# Patient Record
Sex: Male | Born: 1965
Health system: Southern US, Community
[De-identification: ages and names within clinical notes are randomized; demographics above are authoritative.]

## PROBLEM LIST (undated history)

## (undated) DIAGNOSIS — G4730 Sleep apnea, unspecified: Secondary | ICD-10-CM

## (undated) DIAGNOSIS — G588 Other specified mononeuropathies: Secondary | ICD-10-CM

## (undated) DIAGNOSIS — J439 Emphysema, unspecified: Secondary | ICD-10-CM

## (undated) HISTORY — DX: Emphysema, unspecified: J43.9

## (undated) HISTORY — PX: WISDOM TOOTH EXTRACTION: SHX21

## (undated) HISTORY — DX: Sleep apnea, unspecified: G47.30

## (undated) HISTORY — PX: COLONOSCOPY: SHX174

## (undated) HISTORY — DX: Other specified mononeuropathies: G58.8

## (undated) SURGICAL SUPPLY — 3 items
NDL HYPO 21X1.5 SAFETY (NEEDLE) IMPLANT
SOLN 0.9% NACL 1000 ML (IV SOLUTION) ×1 IMPLANT
SOLN STERILE WATER 1000 ML (IV SOLUTION) ×1 IMPLANT

---

## 2009-03-22 ENCOUNTER — Emergency Department (HOSPITAL_COMMUNITY): Admission: EM | Admit: 2009-03-22 | Discharge: 2009-03-22 | Payer: Self-pay | Admitting: Emergency Medicine

## 2012-05-19 ENCOUNTER — Emergency Department (HOSPITAL_COMMUNITY): Payer: 59

## 2012-05-19 ENCOUNTER — Encounter (HOSPITAL_COMMUNITY): Payer: Self-pay | Admitting: Neurology

## 2012-05-19 ENCOUNTER — Emergency Department (HOSPITAL_COMMUNITY)
Admission: EM | Admit: 2012-05-19 | Discharge: 2012-05-19 | Disposition: A | Discharge status: Home / Self Care | Payer: 59 | Attending: Emergency Medicine | Admitting: Emergency Medicine

## 2012-05-19 DIAGNOSIS — Z79899 Other long term (current) drug therapy: Secondary | ICD-10-CM | POA: Insufficient documentation

## 2012-05-19 DIAGNOSIS — R55 Syncope and collapse: Secondary | ICD-10-CM | POA: Insufficient documentation

## 2012-05-19 DIAGNOSIS — F17200 Nicotine dependence, unspecified, uncomplicated: Secondary | ICD-10-CM | POA: Insufficient documentation

## 2012-05-19 DIAGNOSIS — R110 Nausea: Secondary | ICD-10-CM | POA: Insufficient documentation

## 2012-05-19 LAB — BASIC METABOLIC PANEL
CO2: 25 mEq/L (ref 19–32)
Chloride: 104 mEq/L (ref 96–112)
GFR calc Af Amer: >90 mL/min (ref >90)
Potassium: 4.5 mEq/L (ref 3.5–5.1)
Sodium: 137 mEq/L (ref 135–145)

## 2012-05-19 LAB — CBC WITH DIFFERENTIAL/PLATELET
Basophils Absolute: 0.1 10*3/uL (ref 0.0–0.1)
Basophils Relative: 1 % (ref 0–1)
HCT: 43.1 % (ref 39.0–52.0)
Lymphocytes Relative: 11 % — ABNORMAL LOW (ref 12–46)
MCHC: 34.6 g/dL (ref 30.0–36.0)
Monocytes Absolute: 1.2 10*3/uL — ABNORMAL HIGH (ref 0.1–1.0)
Neutro Abs: 14 10*3/uL — ABNORMAL HIGH (ref 1.7–7.7)
Neutrophils Relative %: 81 % — ABNORMAL HIGH (ref 43–77)
Platelets: 249 10*3/uL (ref 150–400)
RDW: 13.1 % (ref 11.5–15.5)
WBC: 17.3 10*3/uL — ABNORMAL HIGH (ref 4.0–10.5)

## 2012-05-19 LAB — TROPONIN I: Troponin I: <0.3 ng/mL (ref <0.30)

## 2012-05-19 MED ORDER — SODIUM CHLORIDE 0.9 % IV SOLN: 1000.0000 mL | INTRAVENOUS | Status: DC

## 2012-05-19 MED ORDER — ONDANSETRON HCL 4 MG/2ML IJ SOLN
INTRAMUSCULAR | Status: AC
  Filled 2012-05-19: qty 2

## 2012-05-19 MED ORDER — SODIUM CHLORIDE 0.9 % IV SOLN
1000.0000 mL | Freq: Once | INTRAVENOUS | Status: AC
  Administered 2012-05-19: 1000 mL via INTRAVENOUS

## 2012-05-19 NOTE — ED Notes (Signed)
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 Walked out of room and laid down, felt weak and was nausea. There was no LOC. Denying any cp or sob. EKG showing inverted t-waves. CBG 131. BP sitting 120/62, standing 170/94. 4 zofran given. 18 gauge L AC. Still feeling dizzy at current. Skin warm and dry. A x 4.

## 2012-05-19 NOTE — ED Notes (Signed)
Pt sitting in chair, reporting dizziness has improved but "Not feeling 100%".

## 2012-05-19 NOTE — ED Notes (Signed)
Patient transported to X-ray 

## 2012-05-19 NOTE — ED Provider Notes (Signed)
History     CSN: [id number]  Arrival date & time 05/19/12  1038   First MD Initiated Contact with Patient 05/19/12 1040      Chief Complaint  Patient presents with  . Near Syncope     The history is provided by the patient.   the patient reports he has been without any symptoms for the past several days and woke up this morning and felt normal.  At work he was walking into a room and became "dizzy" which she describes as a feeling of almost passing out.  He began to feel weak and nauseated and laid himself down on the floor so they didn't pass out.  He had no preceding chest pain or chest tightness.  No palpitations.  He reports at this time he still feels slightly weak but has no other symptoms.  He denies melena and hematochezia.  He denies recent nausea vomiting diarrhea.  He reports his appetite has been normal.  He smokes cigarettes but uses no drugs.  Daily medication he takes is occasional Advil PM at night to help him with sleep.  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  He never usually has breakfast only coffee which he continued to do today.  He reports no significant past medical history but he also reports that he does not have a primary care physician.  He denies all neck pain jaw pain shoulder pain or any other anginal symptoms.  He's had no recent illness  History reviewed. No pertinent past medical history.  History reviewed. No pertinent past surgical history.  No family history on file.  History  Substance Use Topics  . Smoking status: Current Every Day Smoker  . Smokeless tobacco: Not on file  . Alcohol Use: No      Review of Systems  All other systems reviewed and are negative.    Allergies  Review of patient's allergies indicates no known allergies.  Home Medications   Current Outpatient Rx  Name Route Sig Dispense Refill  . ADVIL PM PO Oral Take 1 tablet by  mouth at bedtime as needed. For sleep      BP 109/64  Pulse 64  Temp 98.4 F (36.9 C) (Oral)  Resp 17  SpO2 99%  Physical Exam  Nursing note and vitals reviewed. Constitutional: He is oriented to person, place, and time. He appears well-developed and well-nourished.  HENT:  Head: Normocephalic and atraumatic.  Eyes: EOM are normal. Pupils are equal, round, and reactive to light.  Neck: Normal range of motion.  Cardiovascular: Normal rate, regular rhythm, normal heart sounds and intact distal pulses.   Pulmonary/Chest: Effort normal and breath sounds normal. No respiratory distress.  Abdominal: Soft. He exhibits no distension. There is no tenderness.  Musculoskeletal: Normal range of motion.  Neurological: He is alert and oriented to person, place, and time.       5/5 strength in major muscle groups of  bilateral upper and lower extremities. Speech normal. No facial asymetry.   Skin: Skin is warm and dry.  Psychiatric: He has a normal mood and affect. Judgment normal.    ED Course  Procedures (including critical care time)   Date: 05/19/2012  Rate: 67  Rhythm: normal sinus rhythm  QRS Axis: normal  Intervals: normal  ST/T Wave abnormalities: none specific ST changes, likely early repol  Conduction Disutrbances: none  Narrative Interpretation:  Old EKG Reviewed: No prior ecg     Labs Reviewed  CBC WITH DIFFERENTIAL - Abnormal; Notable for the following:    WBC 17.3 (*)     Neutrophils Relative 81 (*)     Neutro Abs 14.0 (*)     Lymphocytes Relative 11 (*)     Monocytes Absolute 1.2 (*)     All other components within normal limits  BASIC METABOLIC PANEL - Abnormal; Notable for the following:    Glucose, Bld 127 (*)     All other components within normal limits  TROPONIN I   Dg Chest 2 View  05/19/2012  *RADIOLOGY REPORT*  Clinical Data: Syncope at work, nausea  CHEST - 2 VIEW  Comparison: None  Findings: Normal heart size, mediastinal contours, and pulmonary  vascularity. Peribronchial thickening. No definite infiltrate, pleural effusion or pneumothorax. Bones unremarkable.  IMPRESSION: No acute abnormalities.   Original Report Authenticated By: Lollie Marrow, M.D.     I personally reviewed the imaging tests through PACS system    1. Near syncope       MDM  The patient feels much better after IV fluids.  We walked around the emergency department and had no difficulty ambulating.  He has no complaints at this time and states he feels much better.  He's never had any chest pain through any of his events.  I doubt this is a cardiac etiology.  Doubt arrhythmia.  He may have been slightly dehydrated as he feels better after IV fluids.  Normal neurologic exam.  At time of discharge the patient is completely asymptomatic.  I recommended developing a relationship with a primary care physician        Lyanne Co, MD 05/19/12 1414

## 2012-05-19 NOTE — ED Notes (Signed)
Pt ambulated well in hallway. Tolerated well.

## 2015-02-17 ENCOUNTER — Other Ambulatory Visit (HOSPITAL_COMMUNITY): Payer: Self-pay | Admitting: Preventative Medicine

## 2015-02-17 DIAGNOSIS — IMO0002 Reserved for concepts with insufficient information to code with codable children: Secondary | ICD-10-CM

## 2015-02-17 DIAGNOSIS — R9389 Abnormal findings on diagnostic imaging of other specified body structures: Secondary | ICD-10-CM

## 2015-02-17 DIAGNOSIS — R229 Localized swelling, mass and lump, unspecified: Secondary | ICD-10-CM

## 2015-02-21 ENCOUNTER — Ambulatory Visit (HOSPITAL_COMMUNITY)
Admission: RE | Admit: 2015-02-21 | Discharge: 2015-02-21 | Disposition: A | Discharge status: Home / Self Care | Payer: 59 | Source: Ambulatory Visit | Attending: Preventative Medicine | Admitting: Preventative Medicine

## 2015-02-21 DIAGNOSIS — F17200 Nicotine dependence, unspecified, uncomplicated: Secondary | ICD-10-CM | POA: Diagnosis not present

## 2015-02-21 DIAGNOSIS — IMO0002 Reserved for concepts with insufficient information to code with codable children: Secondary | ICD-10-CM

## 2015-02-21 DIAGNOSIS — R229 Localized swelling, mass and lump, unspecified: Secondary | ICD-10-CM

## 2015-02-21 DIAGNOSIS — J439 Emphysema, unspecified: Secondary | ICD-10-CM | POA: Insufficient documentation

## 2015-02-21 DIAGNOSIS — R918 Other nonspecific abnormal finding of lung field: Secondary | ICD-10-CM | POA: Diagnosis present

## 2015-02-21 DIAGNOSIS — R9389 Abnormal findings on diagnostic imaging of other specified body structures: Secondary | ICD-10-CM

## 2015-02-21 MED ORDER — IOHEXOL 300 MG/ML  SOLN
75.0000 mL | Freq: Once | INTRAMUSCULAR | Status: AC | PRN
  Administered 2015-02-21: 75 mL via INTRAVENOUS

## 2015-03-10 ENCOUNTER — Other Ambulatory Visit (HOSPITAL_COMMUNITY): Payer: Self-pay | Admitting: Respiratory Therapy

## 2015-03-10 DIAGNOSIS — J439 Emphysema, unspecified: Secondary | ICD-10-CM

## 2015-03-22 ENCOUNTER — Ambulatory Visit (HOSPITAL_COMMUNITY)
Admission: RE | Admit: 2015-03-22 | Discharge: 2015-03-22 | Disposition: A | Discharge status: Home / Self Care | Payer: 59 | Source: Ambulatory Visit | Attending: Pulmonary Disease | Admitting: Pulmonary Disease

## 2015-03-22 DIAGNOSIS — J439 Emphysema, unspecified: Secondary | ICD-10-CM | POA: Diagnosis present

## 2015-03-22 MED ORDER — ALBUTEROL SULFATE (2.5 MG/3ML) 0.083% IN NEBU
2.5000 mg | INHALATION_SOLUTION | Freq: Once | RESPIRATORY_TRACT | Status: AC
  Administered 2015-03-22: 2.5 mg via RESPIRATORY_TRACT

## 2015-03-23 LAB — PULMONARY FUNCTION TEST
DL/VA % pred: 70 %
DL/VA: 3.24 ml/min/mmHg/L
DLCO UNC: 24.69 ml/min/mmHg
DLCO unc % pred: 79 %
FEF 25-75 POST: 3.06 L/s
FEF 25-75 Pre: 3.14 L/sec
FEF2575-%CHANGE-POST: -2 %
FEF2575-%PRED-POST: 86 %
FEF2575-%PRED-PRE: 88 %
FEV1-%Change-Post: 0 %
FEV1-%PRED-POST: 90 %
FEV1-%Pred-Pre: 90 %
FEV1-POST: 3.51 L
FEV1-PRE: 3.52 L
FEV1FVC-%Change-Post: 2 %
FEV1FVC-%PRED-PRE: 99 %
FEV6-%Change-Post: -1 %
FEV6-%Pred-Post: 92 %
FEV6-%Pred-Pre: 94 %
FEV6-POST: 4.45 L
FEV6-Pre: 4.54 L
FEV6FVC-%CHANGE-POST: 0 %
FEV6FVC-%Pred-Post: 103 %
FEV6FVC-%Pred-Pre: 102 %
FVC-%CHANGE-POST: -2 %
FVC-%Pred-Post: 89 %
FVC-%Pred-Pre: 91 %
FVC-POST: 4.45 L
FVC-Pre: 4.56 L
PRE FEV1/FVC RATIO: 77 %
PRE FEV6/FVC RATIO: 100 %
Post FEV1/FVC ratio: 79 %
Post FEV6/FVC ratio: 100 %
RV % PRED: 89 %
RV: 1.72 L
TLC % pred: 93 %
TLC: 6.3 L

## 2015-08-11 ENCOUNTER — Encounter: Payer: Self-pay | Admitting: Family Medicine

## 2015-08-11 ENCOUNTER — Ambulatory Visit (INDEPENDENT_AMBULATORY_CARE_PROVIDER_SITE_OTHER): Payer: 59 | Admitting: Family Medicine

## 2015-08-11 VITALS — BP 128/82 | Ht 69.0 in | Wt 218.8 lb

## 2015-08-11 DIAGNOSIS — R5383 Other fatigue: Secondary | ICD-10-CM

## 2015-08-11 DIAGNOSIS — R0683 Snoring: Secondary | ICD-10-CM

## 2015-08-11 DIAGNOSIS — R0600 Dyspnea, unspecified: Secondary | ICD-10-CM

## 2015-08-11 DIAGNOSIS — R0609 Other forms of dyspnea: Secondary | ICD-10-CM

## 2015-08-11 DIAGNOSIS — L709 Acne, unspecified: Secondary | ICD-10-CM

## 2015-08-11 NOTE — Progress Notes (Signed)
   Subjective:    Patient ID: Aaron Griffin, male    DOB: November 13, 1965, 49 y.o.   MRN: [id number]  HPI  The patient comes in today for a wellness visit.    A review of their health history was completed.  A review of medications was also completed.  Any needed refills; none  Eating habits: no but sometimes tries  Falls/  MVA accidents in past few months: no  Regular exercise: physical job, walk and golf  Specialist pt sees on regular basis: Dr Luan Pulling for pulmonary nodule  Preventative health issues were discussed.   Additional concerns: fatigue  no family history of colon or prostate cancer  So even though patient came in for a wellness exam his main focus was his fatigue and tiredness he actually wanted are full focus to be on this and do a wellness at another time he no longer smokes he does not drink he relates that he denies any chest tightness but does get short of breath whenever he pushes himself in addition to this patient relates a time tiredness and fatigue that does not get better with rest he finds himself feeling fatigued quite easily he denies rectal bleeding denies hematuria he does state he snores at night Review of Systems  Constitutional: Negative for activity change, appetite change and fatigue.  HENT: Negative for congestion.   Respiratory: Negative for cough.   Cardiovascular: Negative for chest pain.  Gastrointestinal: Negative for abdominal pain.  Endocrine: Negative for polydipsia and polyphagia.  Musculoskeletal: Positive for myalgias and arthralgias.  Neurological: Negative for weakness.  Psychiatric/Behavioral: Negative for confusion.       Objective:   Physical Exam  Constitutional: He appears well-nourished. No distress.  Cardiovascular: Normal rate, regular rhythm and normal heart sounds.   No murmur heard. Pulmonary/Chest: Effort normal and breath sounds normal. No respiratory distress.  Musculoskeletal: He exhibits no edema.    Lymphadenopathy:    He has no cervical adenopathy.  Neurological: He is alert.  Psychiatric: His behavior is normal.  Vitals reviewed.  In order to comprehensively evaluate his fatigue and tiredness multiple discussions and questions were had greater than half was spent in answering questions 30 minutes spent with patient       Assessment & Plan:  Patient came in today may focus was his fatigue. This is multi-factorial. Partly deconditioning, mild obesity, former smoker, possible sleep apnea  Will do lab work. Probably will also need sleep study Has elevated risk the [place] eating regular physical activity.

## 2015-08-16 LAB — HEPATIC FUNCTION PANEL
ALBUMIN: 4.3 g/dL (ref 3.5–5.5)
ALT: 39 IU/L (ref 0–44)
AST: 25 IU/L (ref 0–40)
Alkaline Phosphatase: 69 IU/L (ref 39–117)
BILIRUBIN TOTAL: 0.4 mg/dL (ref 0.0–1.2)
Bilirubin, Direct: 0.09 mg/dL (ref 0.00–0.40)
TOTAL PROTEIN: 7.5 g/dL (ref 6.0–8.5)

## 2015-08-16 LAB — BASIC METABOLIC PANEL
BUN / CREAT RATIO: 13 (ref 9–20)
BUN: 14 mg/dL (ref 6–24)
CHLORIDE: 101 mmol/L (ref 96–106)
CO2: 22 mmol/L (ref 18–29)
Calcium: 9.6 mg/dL (ref 8.7–10.2)
Creatinine, Ser: 1.12 mg/dL (ref 0.76–1.27)
GFR, EST AFRICAN AMERICAN: 90 mL/min/{1.73_m2} (ref >59)
GFR, EST NON AFRICAN AMERICAN: 78 mL/min/{1.73_m2} (ref >59)
Glucose: 107 mg/dL — ABNORMAL HIGH (ref 65–99)
POTASSIUM: 4.4 mmol/L (ref 3.5–5.2)
SODIUM: 140 mmol/L (ref 134–144)

## 2015-08-16 LAB — CBC WITH DIFFERENTIAL/PLATELET
BASOS: 1 %
Basophils Absolute: 0.1 10*3/uL (ref 0.0–0.2)
EOS (ABSOLUTE): 0.3 10*3/uL (ref 0.0–0.4)
EOS: 3 %
HEMATOCRIT: 42.2 % (ref 37.5–51.0)
Hemoglobin: 14.5 g/dL (ref 12.6–17.7)
Immature Grans (Abs): 0 10*3/uL (ref 0.0–0.1)
Immature Granulocytes: 0 %
LYMPHS ABS: 4 10*3/uL — AB (ref 0.7–3.1)
Lymphs: 40 %
MCH: 31 pg (ref 26.6–33.0)
MCHC: 34.4 g/dL (ref 31.5–35.7)
MCV: 90 fL (ref 79–97)
MONOS ABS: 1.1 10*3/uL — AB (ref 0.1–0.9)
Monocytes: 12 %
Neutrophils Absolute: 4.4 10*3/uL (ref 1.4–7.0)
Neutrophils: 44 %
PLATELETS: 284 10*3/uL (ref 150–379)
RBC: 4.67 x10E6/uL (ref 4.14–5.80)
RDW: 13.4 % (ref 12.3–15.4)
WBC: 10 10*3/uL (ref 3.4–10.8)

## 2015-08-16 LAB — C-REACTIVE PROTEIN: CRP: 2.1 mg/L (ref 0.0–4.9)

## 2015-08-16 LAB — TESTOSTERONE: Testosterone: 410 ng/dL (ref 348–1197)

## 2015-08-16 LAB — LIPID PANEL
CHOLESTEROL TOTAL: 219 mg/dL — AB (ref 100–199)
Chol/HDL Ratio: 6.6 ratio units — ABNORMAL HIGH (ref 0.0–5.0)
HDL: 33 mg/dL — ABNORMAL LOW (ref >39)
LDL CALC: 107 mg/dL — AB (ref 0–99)
Triglycerides: 393 mg/dL — ABNORMAL HIGH (ref 0–149)
VLDL Cholesterol Cal: 79 mg/dL — ABNORMAL HIGH (ref 5–40)

## 2015-08-16 LAB — TSH: TSH: 2.12 u[IU]/mL (ref 0.450–4.500)

## 2015-08-16 LAB — SEDIMENTATION RATE: Sed Rate: 18 mm/hr — ABNORMAL HIGH (ref 0–15)

## 2015-08-16 LAB — T4, FREE: FREE T4: 1.07 ng/dL (ref 0.82–1.77)

## 2015-08-17 NOTE — Addendum Note (Signed)
Addended by: Launa Grill on: 08/17/2015 08:25 AM   Modules accepted: Orders

## 2015-08-23 ENCOUNTER — Encounter: Payer: Self-pay | Admitting: Family Medicine

## 2015-10-17 ENCOUNTER — Other Ambulatory Visit (HOSPITAL_COMMUNITY): Payer: Self-pay | Admitting: Respiratory Therapy

## 2015-10-17 DIAGNOSIS — R0683 Snoring: Secondary | ICD-10-CM

## 2015-10-17 DIAGNOSIS — G4730 Sleep apnea, unspecified: Secondary | ICD-10-CM

## 2015-11-02 ENCOUNTER — Ambulatory Visit: Payer: 59 | Attending: Family Medicine | Admitting: Sleep Medicine

## 2015-11-02 DIAGNOSIS — G4733 Obstructive sleep apnea (adult) (pediatric): Secondary | ICD-10-CM | POA: Insufficient documentation

## 2015-11-02 DIAGNOSIS — G4730 Sleep apnea, unspecified: Secondary | ICD-10-CM

## 2015-11-02 DIAGNOSIS — R5383 Other fatigue: Secondary | ICD-10-CM | POA: Diagnosis present

## 2015-11-02 DIAGNOSIS — R0683 Snoring: Secondary | ICD-10-CM | POA: Diagnosis present

## 2015-11-16 NOTE — Sleep Study (Signed)
  Matheny A. Merlene Laughter, MD     [email or web address]             NOCTURNAL POLYSOMNOGRAPHY   LOCATION: [facility]  Patient Name: Aaron Griffin, Aaron Griffin Date: 11/02/2015 Gender: Male D.O.B: August 09, 1966 Age (years): 84 Referring Provider: Not Available Height (inches): 69 Interpreting Physician: Phillips Odor MD, ABSM Weight (lbs): 215 RPSGT: Peak, Robert BMI: 32 MRN: [id number] Neck Size: 16.50 CLINICAL INFORMATION Sleep Study Type: HST Indication for sleep study: Fatigue, Snoring Epworth Sleepiness Score: SLEEP STUDY TECHNIQUE A multi-channel overnight portable sleep study was performed. The channels recorded were: nasal airflow, thoracic respiratory movement, and oxygen saturation with a pulse oximetry. Snoring was also monitored. MEDICATIONS Patient self administered medications include: N/A. No current outpatient prescriptions on file.  SLEEP ARCHITECTURE Patient was studied for 404.7 minutes. The sleep efficiency was 99.9 % and the patient was supine for 0%. The arousal index was 0.0 per hour. RESPIRATORY PARAMETERS The overall AHI was 37.5 per hour, with a central apnea index of 4.7 per hour. The oxygen nadir was 78% during sleep. CARDIAC DATA Mean heart rate during sleep was bpm.   IMPRESSIONS - Severe obstructive sleep apnea occurred during this study (AHI = 37.5/h). Suggest formal CPAP titration study.     Delano Metz, MD Diplomate, American Board of Sleep Medicine.

## 2015-11-29 ENCOUNTER — Telehealth: Payer: Self-pay | Admitting: Family Medicine

## 2015-11-29 NOTE — Telephone Encounter (Signed)
Home sleep test results are complete & under "notes" in chart review

## 2015-11-30 ENCOUNTER — Encounter: Payer: Self-pay | Admitting: Family Medicine

## 2015-11-30 DIAGNOSIS — G4733 Obstructive sleep apnea (adult) (pediatric): Secondary | ICD-10-CM | POA: Insufficient documentation

## 2015-11-30 NOTE — Telephone Encounter (Signed)
Nurse's-please call patient-The patient's sleep study shows significant sleep apnea. Highly recommended to start CPAP. The patient has 2 choices. Choice #1-we will go ahead and make the referral for him to get the CPAP machine to go ahead and start this plus have the patient schedule a follow-up office visit in 3-4 weeks choice #2-if the patient is hesitant to start a CPAP machine-patient to follow-up within the next 14 days to discuss what sleep apnea is and why a CPAP is so important. I will be more than willing to meet with the patient face-to-face to discuss the meaning of sleep apnea. If the patient once to go ahead and start referral for this CPAP machine I would recommend trying to get this through [place] with C[facility] Pina they appeared to do a great job regarding this. They can make sure that the patient's insurance would cover the CPAP machine there. He would need auto titration. If any questions please ask me.

## 2015-12-01 NOTE — Telephone Encounter (Signed)
Discussed with pt. Pt notified that pharm will contact him to bring out cpap.

## 2015-12-01 NOTE — Telephone Encounter (Signed)
Pt wants to go ahead and get cpap and then schedule follow up in 3 -4 weeks.

## 2015-12-06 NOTE — Telephone Encounter (Signed)
Order written & given to Dr. Nicki Reaper for signature Once faxed to [facility] will notify pt & have pt schedule follow up per Dr. Bary Leriche request

## 2015-12-07 NOTE — Telephone Encounter (Signed)
Spoke with pt, explained that order for CPAP was sent & that Caryl Pina with [facility] would contact him to get things set up, also explained for pt to call us to follow up 3-4 weeks after starting to use the CPAP, pt verbalized understanding

## 2016-02-15 ENCOUNTER — Encounter: Payer: Self-pay | Admitting: Family Medicine

## 2016-02-15 ENCOUNTER — Ambulatory Visit (INDEPENDENT_AMBULATORY_CARE_PROVIDER_SITE_OTHER): Payer: 59 | Admitting: Family Medicine

## 2016-02-15 ENCOUNTER — Telehealth: Payer: Self-pay | Admitting: Family Medicine

## 2016-02-15 VITALS — BP 108/76 | Ht 69.0 in | Wt 216.0 lb

## 2016-02-15 DIAGNOSIS — R739 Hyperglycemia, unspecified: Secondary | ICD-10-CM | POA: Diagnosis not present

## 2016-02-15 DIAGNOSIS — G4733 Obstructive sleep apnea (adult) (pediatric): Secondary | ICD-10-CM

## 2016-02-15 DIAGNOSIS — E781 Pure hyperglyceridemia: Secondary | ICD-10-CM

## 2016-02-15 DIAGNOSIS — E785 Hyperlipidemia, unspecified: Secondary | ICD-10-CM | POA: Insufficient documentation

## 2016-02-15 NOTE — Telephone Encounter (Signed)
CPAP download Dr. Nicki Reaper requested from [place] [facility] came in In a red folder & sent back to him

## 2016-02-15 NOTE — Progress Notes (Signed)
   Subjective:    Patient ID: Aaron Griffin, male    DOB: 1965/11/14, 50 y.o.   MRN: [id number]  HPI  Patient in today for a follow up on CPAP usage. Has been on CPAP therapy for approximately 2 months. States has noticed improvement with sleep quality and feeling less fatigue during the day.  States no other concerns this visit. He states his energy level is doing better. He is working on diet and working on increasing exercise. Previous lab work reviewed with patient  Review of Systems He denies any chest tightness pressure pain shortness breath nausea vomiting or diarrhea    Objective:   Physical Exam  Lungs clear heart regular pulse normal extremities no edema      Assessment & Plan:  Sleep apnea-actually doing much better on CPAP sheen. We will have their medical supply or send [place] updated information regarding usage patient states using it 100% of the time and it is helping  Hyperglycemia he will go on a low carbohydrate diet regular physical activity and exercise  Patient will significantly work hard at exercise and losing weight and repeat his cluster profile in 8 weeks we will check hemoglobin A1c glucose and lipid previous labs reviewed with patient  Follow-up will be based upon the results of his lab work in early September

## 2016-02-18 NOTE — Telephone Encounter (Signed)
The CPAP download shows good usage and good effectiveness. Patient states that he does benefit from the treatment.

## 2016-03-04 ENCOUNTER — Encounter: Payer: Self-pay | Admitting: Family Medicine

## 2016-03-05 ENCOUNTER — Encounter: Payer: Self-pay | Admitting: Family Medicine

## 2016-03-05 ENCOUNTER — Ambulatory Visit (INDEPENDENT_AMBULATORY_CARE_PROVIDER_SITE_OTHER): Payer: 59 | Admitting: Family Medicine

## 2016-03-05 VITALS — BP 122/78 | Ht 69.0 in | Wt 216.0 lb

## 2016-03-05 DIAGNOSIS — E860 Dehydration: Secondary | ICD-10-CM

## 2016-03-05 DIAGNOSIS — T675XXA Heat exhaustion, unspecified, initial encounter: Secondary | ICD-10-CM

## 2016-03-05 NOTE — Progress Notes (Signed)
   Subjective:    Patient ID: Aaron Griffin, male    DOB: [birth year], 50 y.o.   MRN: [id number]  HPIpt had 3 epidsodes since friday where he got dizzy and felt like he was going to pass out. Also had some nausea. Pt states he felt like he got too hot. After getting in air condition he felt better. Last time it happened was Sunday. Pt has been drinking more fluids since then.   Had a couple different times where he felt overheated. Felt nauseous headache felt dizzy. Got better after he took in fluids and cooled off. Denied any chest pressure tightness.  Review of Systems Denies chest pain shortness breath nausea vomiting diarrhea relates feeling fairly well today    Objective:   Physical Exam Lungs clear hearts regular pulse normal abdomen soft extremities no edema skin warm dry vital signs stable       Assessment & Plan:  Probable heat exhaustion check lab work keep well-hydrated use adequate amount of salt taking in adequate amount of carbohydrates. I doubt any type underlying heart disease or neurologic disease

## 2016-03-06 LAB — CBC WITH DIFFERENTIAL/PLATELET
BASOS ABS: 0.1 10*3/uL (ref 0.0–0.2)
Basos: 1 %
EOS (ABSOLUTE): 0.3 10*3/uL (ref 0.0–0.4)
Eos: 3 %
HEMOGLOBIN: 13.4 g/dL (ref 12.6–17.7)
Hematocrit: 38.4 % (ref 37.5–51.0)
IMMATURE GRANS (ABS): 0 10*3/uL (ref 0.0–0.1)
Immature Granulocytes: 0 %
LYMPHS: 29 %
Lymphocytes Absolute: 2.9 10*3/uL (ref 0.7–3.1)
MCH: 31.2 pg (ref 26.6–33.0)
MCHC: 34.9 g/dL (ref 31.5–35.7)
MCV: 89 fL (ref 79–97)
MONOCYTES: 13 %
Monocytes Absolute: 1.2 10*3/uL — ABNORMAL HIGH (ref 0.1–0.9)
NEUTROS PCT: 54 %
Neutrophils Absolute: 5.3 10*3/uL (ref 1.4–7.0)
Platelets: 278 10*3/uL (ref 150–379)
RBC: 4.3 x10E6/uL (ref 4.14–5.80)
RDW: 13.8 % (ref 12.3–15.4)
WBC: 9.9 10*3/uL (ref 3.4–10.8)

## 2016-03-06 LAB — BASIC METABOLIC PANEL
BUN / CREAT RATIO: 17 (ref 9–20)
BUN: 17 mg/dL (ref 6–24)
CO2: 23 mmol/L (ref 18–29)
CREATININE: 1.03 mg/dL (ref 0.76–1.27)
Calcium: 9.9 mg/dL (ref 8.7–10.2)
Chloride: 99 mmol/L (ref 96–106)
GFR, EST AFRICAN AMERICAN: 100 mL/min/{1.73_m2} (ref >59)
GFR, EST NON AFRICAN AMERICAN: 86 mL/min/{1.73_m2} (ref >59)
Glucose: 81 mg/dL (ref 65–99)
POTASSIUM: 4.2 mmol/L (ref 3.5–5.2)
SODIUM: 141 mmol/L (ref 134–144)

## 2016-04-20 LAB — LIPID PANEL
CHOLESTEROL TOTAL: 198 mg/dL (ref 100–199)
Chol/HDL Ratio: 5.7 ratio units — ABNORMAL HIGH (ref 0.0–5.0)
HDL: 35 mg/dL — ABNORMAL LOW (ref >39)
LDL CALC: 120 mg/dL — AB (ref 0–99)
TRIGLYCERIDES: 214 mg/dL — AB (ref 0–149)
VLDL Cholesterol Cal: 43 mg/dL — ABNORMAL HIGH (ref 5–40)

## 2016-04-20 LAB — HEMOGLOBIN A1C
ESTIMATED AVERAGE GLUCOSE: 126 mg/dL
Hgb A1c MFr Bld: 6 % — ABNORMAL HIGH (ref 4.8–5.6)

## 2016-04-20 LAB — GLUCOSE, RANDOM: Glucose: 109 mg/dL — ABNORMAL HIGH (ref 65–99)

## 2016-04-29 ENCOUNTER — Encounter: Payer: Self-pay | Admitting: Family Medicine

## 2016-04-29 ENCOUNTER — Ambulatory Visit (INDEPENDENT_AMBULATORY_CARE_PROVIDER_SITE_OTHER): Payer: 59 | Admitting: Family Medicine

## 2016-04-29 VITALS — BP 108/78 | Ht 69.0 in | Wt 211.0 lb

## 2016-04-29 DIAGNOSIS — Z79899 Other long term (current) drug therapy: Secondary | ICD-10-CM | POA: Diagnosis not present

## 2016-04-29 DIAGNOSIS — Z23 Encounter for immunization: Secondary | ICD-10-CM | POA: Diagnosis not present

## 2016-04-29 DIAGNOSIS — Z13220 Encounter for screening for lipoid disorders: Secondary | ICD-10-CM

## 2016-04-29 DIAGNOSIS — E785 Hyperlipidemia, unspecified: Secondary | ICD-10-CM

## 2016-04-29 DIAGNOSIS — R7303 Prediabetes: Secondary | ICD-10-CM | POA: Diagnosis not present

## 2016-04-29 DIAGNOSIS — Z131 Encounter for screening for diabetes mellitus: Secondary | ICD-10-CM

## 2016-04-29 NOTE — Patient Instructions (Signed)
DASH Eating Plan  DASH stands for "Dietary Approaches to Stop Hypertension." The DASH eating plan is a healthy eating plan that has been shown to reduce high blood pressure (hypertension). Additional health benefits may include reducing the risk of type 2 diabetes mellitus, heart disease, and stroke. The DASH eating plan may also help with weight loss.  WHAT DO I NEED TO KNOW ABOUT THE DASH EATING PLAN?  For the DASH eating plan, you will follow these general guidelines:  · Choose foods with a percent daily value for sodium of less than 5% (as listed on the food label).  · Use salt-free seasonings or herbs instead of table salt or sea salt.  · Check with your health care provider or pharmacist before using salt substitutes.  · Eat lower-sodium products, often labeled as "lower sodium" or "no salt added."  · Eat fresh foods.  · Eat more vegetables, fruits, and low-fat dairy products.  · Choose whole grains. Look for the word "whole" as the first word in the ingredient list.  · Choose fish and skinless chicken or turkey more often than red meat. Limit fish, poultry, and meat to 6 oz (170 g) each day.  · Limit sweets, desserts, sugars, and sugary drinks.  · Choose heart-healthy fats.  · Limit cheese to 1 oz (28 g) per day.  · Eat more home-cooked food and less restaurant, buffet, and fast food.  · Limit fried foods.  · Cook foods using methods other than frying.  · Limit canned vegetables. If you do use them, rinse them well to decrease the sodium.  · When eating at a restaurant, ask that your food be prepared with less salt, or no salt if possible.  WHAT FOODS CAN I EAT?  Seek help from a dietitian for individual calorie needs.  Grains  Whole grain or whole wheat bread. Brown rice. Whole grain or whole wheat pasta. Quinoa, bulgur, and whole grain cereals. Low-sodium cereals. Corn or whole wheat flour tortillas. Whole grain cornbread. Whole grain crackers. Low-sodium crackers.  Vegetables  Fresh or frozen vegetables  (raw, steamed, roasted, or grilled). Low-sodium or reduced-sodium tomato and vegetable juices. Low-sodium or reduced-sodium tomato sauce and paste. Low-sodium or reduced-sodium canned vegetables.   Fruits  All fresh, canned (in natural juice), or frozen fruits.  Meat and Other Protein Products  Ground beef (85% or leaner), grass-fed beef, or beef trimmed of fat. Skinless chicken or turkey. Ground chicken or turkey. Pork trimmed of fat. All fish and seafood. Eggs. Dried beans, peas, or lentils. Unsalted nuts and seeds. Unsalted canned beans.  Dairy  Low-fat dairy products, such as skim or 1% milk, 2% or reduced-fat cheeses, low-fat ricotta or cottage cheese, or plain low-fat yogurt. Low-sodium or reduced-sodium cheeses.  Fats and Oils  Tub margarines without trans fats. Light or reduced-fat mayonnaise and salad dressings (reduced sodium). Avocado. Safflower, olive, or canola oils. Natural peanut or almond butter.  Other  Unsalted popcorn and pretzels.  The items listed above may not be a complete list of recommended foods or beverages. Contact your dietitian for more options.  WHAT FOODS ARE NOT RECOMMENDED?  Grains  White bread. White pasta. White rice. Refined cornbread. Bagels and croissants. Crackers that contain trans fat.  Vegetables  Creamed or fried vegetables. Vegetables in a cheese sauce. Regular canned vegetables. Regular canned tomato sauce and paste. Regular tomato and vegetable juices.  Fruits  Dried fruits. Canned fruit in light or heavy syrup. Fruit juice.  Meat and Other Protein   Products  Fatty cuts of meat. Ribs, chicken wings, bacon, sausage, bologna, salami, chitterlings, fatback, hot dogs, bratwurst, and packaged luncheon meats. Salted nuts and seeds. Canned beans with salt.  Dairy  Whole or 2% milk, cream, half-and-half, and cream cheese. Whole-fat or sweetened yogurt. Full-fat cheeses or blue cheese. Nondairy creamers and whipped toppings. Processed cheese, cheese spreads, or cheese  curds.  Condiments  Onion and garlic salt, seasoned salt, table salt, and sea salt. Canned and packaged gravies. Worcestershire sauce. Tartar sauce. Barbecue sauce. Teriyaki sauce. Soy sauce, including reduced sodium. Steak sauce. Fish sauce. Oyster sauce. Cocktail sauce. Horseradish. Ketchup and mustard. Meat flavorings and tenderizers. Bouillon cubes. Hot sauce. Tabasco sauce. Marinades. Taco seasonings. Relishes.  Fats and Oils  Butter, stick margarine, lard, shortening, ghee, and bacon fat. Coconut, palm kernel, or palm oils. Regular salad dressings.  Other  Pickles and olives. Salted popcorn and pretzels.  The items listed above may not be a complete list of foods and beverages to avoid. Contact your dietitian for more information.  WHERE CAN I FIND MORE INFORMATION?  National Heart, Lung, and Blood Institute: www.nhlbi.nih.gov/health/health-topics/topics/dash/     This information is not intended to replace advice given to you by your health care provider. Make sure you discuss any questions you have with your health care provider.     Document Released: 07/18/2011 Document Revised: 08/19/2014 Document Reviewed: 06/02/2013  Elsevier Interactive Patient Education ©2016 Elsevier Inc.

## 2016-04-29 NOTE — Progress Notes (Signed)
   Subjective:    Patient ID: Aaron Griffin, male    DOB: [birth year], 50 y.o.   MRN: [id number]  HPI Patient is here today to discuss his recent lab work results. Results are in the system. Patient has no concerns at this time.    The patient recently has been trying to watch his diet he is trying to lose weight. He denies any type of chest tightness pressure pain or shortness of breath. He does state diabetes runs in his family. He is trying to cutback on starches.     Review of Systems  Constitutional: Negative for activity change, appetite change and fatigue.  HENT: Negative for congestion.   Respiratory: Negative for cough.   Cardiovascular: Negative for chest pain.  Gastrointestinal: Negative for abdominal pain.  Endocrine: Negative for polydipsia and polyphagia.  Neurological: Negative for weakness.  Psychiatric/Behavioral: Negative for confusion.       Objective:   Physical Exam  Constitutional: He appears well-nourished. No distress.  Cardiovascular: Normal rate, regular rhythm and normal heart sounds.   No murmur heard. Pulmonary/Chest: Effort normal and breath sounds normal. No respiratory distress.  Musculoskeletal: He exhibits no edema.  Lymphadenopathy:    He has no cervical adenopathy.  Neurological: He is alert.  Psychiatric: His behavior is normal.  Vitals reviewed.   Patient used to smoke no longer does no family history of premature heart disease       Assessment & Plan:   Slight hyperlipidemia Prediabetes Discussion held regarding exercise losing weight Risk for heart disease 3.5% over the next 10 years Hold off on any medications currently Diet exercise losing weight rechecking lab work in 4 months with follow-up office visit.

## 2016-08-23 DIAGNOSIS — G4733 Obstructive sleep apnea (adult) (pediatric): Secondary | ICD-10-CM | POA: Diagnosis not present

## 2016-08-26 DIAGNOSIS — Z13220 Encounter for screening for lipoid disorders: Secondary | ICD-10-CM | POA: Diagnosis not present

## 2016-08-26 DIAGNOSIS — Z79899 Other long term (current) drug therapy: Secondary | ICD-10-CM | POA: Diagnosis not present

## 2016-08-26 DIAGNOSIS — Z131 Encounter for screening for diabetes mellitus: Secondary | ICD-10-CM | POA: Diagnosis not present

## 2016-08-27 LAB — LIPID PANEL
CHOLESTEROL TOTAL: 207 mg/dL — AB (ref 100–199)
Chol/HDL Ratio: 4.8 ratio units (ref 0.0–5.0)
HDL: 43 mg/dL (ref >39)
LDL Calculated: 123 mg/dL — ABNORMAL HIGH (ref 0–99)
Triglycerides: 207 mg/dL — ABNORMAL HIGH (ref 0–149)
VLDL CHOLESTEROL CAL: 41 mg/dL — AB (ref 5–40)

## 2016-08-27 LAB — BASIC METABOLIC PANEL
BUN/Creatinine Ratio: 14 (ref 9–20)
BUN: 16 mg/dL (ref 6–24)
CALCIUM: 9.7 mg/dL (ref 8.7–10.2)
CO2: 25 mmol/L (ref 18–29)
CREATININE: 1.15 mg/dL (ref 0.76–1.27)
Chloride: 100 mmol/L (ref 96–106)
GFR calc Af Amer: 87 mL/min/{1.73_m2} (ref >59)
GFR, EST NON AFRICAN AMERICAN: 75 mL/min/{1.73_m2} (ref >59)
GLUCOSE: 96 mg/dL (ref 65–99)
Potassium: 5 mmol/L (ref 3.5–5.2)
Sodium: 140 mmol/L (ref 134–144)

## 2016-08-27 LAB — HEMOGLOBIN A1C
ESTIMATED AVERAGE GLUCOSE: 117 mg/dL
Hgb A1c MFr Bld: 5.7 % — ABNORMAL HIGH (ref 4.8–5.6)

## 2016-09-03 ENCOUNTER — Encounter: Payer: Self-pay | Admitting: Family Medicine

## 2016-09-03 ENCOUNTER — Ambulatory Visit (INDEPENDENT_AMBULATORY_CARE_PROVIDER_SITE_OTHER): Payer: 59 | Admitting: Family Medicine

## 2016-09-03 VITALS — BP 122/82 | Ht 69.0 in | Wt 201.4 lb

## 2016-09-03 DIAGNOSIS — J019 Acute sinusitis, unspecified: Secondary | ICD-10-CM | POA: Diagnosis not present

## 2016-09-03 DIAGNOSIS — B9689 Other specified bacterial agents as the cause of diseases classified elsewhere: Secondary | ICD-10-CM | POA: Diagnosis not present

## 2016-09-03 DIAGNOSIS — G4733 Obstructive sleep apnea (adult) (pediatric): Secondary | ICD-10-CM | POA: Diagnosis not present

## 2016-09-03 DIAGNOSIS — E781 Pure hyperglyceridemia: Secondary | ICD-10-CM | POA: Diagnosis not present

## 2016-09-03 MED ORDER — AMOXICILLIN 500 MG PO TABS: 500.0000 mg | ORAL_TABLET | Freq: Three times a day (TID) | ORAL | 0 refills | Status: DC

## 2016-09-03 NOTE — Progress Notes (Signed)
   Subjective:    Patient ID: Aaron Griffin, male    DOB: May 06, 1966, 51 y.o.   MRN: [id number]  HPI  Patient arrives for a follow up on prediabetes. Patient currently not on any medication and had follow up labs last week he would like to discuss results.Patient has been exercising watching diet and has lost some weight He does not smoke Denies any chest tightness pressure pain shortness breath    Sat Jan 6th- felt sx of tilting Had to rest on couch Lasted 3 to 4 min Sweating with it Nausea He states he had a spell several years back rehabilitation dizziness then he had a spell earlier this month where he had dizziness felt like everything was turning he fell off balance nauseated denied any severe headache denied any unilateral numbness or weakness.   Review of Systems Please see above no fevers no other spells outside what described above    Objective:   Physical Exam Finger to nose is normal Romberg negative EOMI visual fields normal lungs are clear no crackles heart regular pulse normal  Patient has sleep apnea uses a CPAP machine regularly     Assessment & Plan:  Prediabetes much better control Weight loss congratulated Blood pressure good control Cholesterol does not need to be on medication currently Lab work again in 6 months time It is possible there may be a mild sinus infection amoxicillin 10 days as described/prescribed Inner ear dysfunction- epley maneuver recommended. Patient to follow-up if ongoing troubles may need referral to ENT

## 2016-09-23 DIAGNOSIS — G4733 Obstructive sleep apnea (adult) (pediatric): Secondary | ICD-10-CM | POA: Diagnosis not present

## 2017-02-11 ENCOUNTER — Telehealth: Payer: Self-pay | Admitting: Family Medicine

## 2017-02-11 DIAGNOSIS — E781 Pure hyperglyceridemia: Secondary | ICD-10-CM

## 2017-02-11 DIAGNOSIS — R739 Hyperglycemia, unspecified: Secondary | ICD-10-CM

## 2017-02-11 NOTE — Telephone Encounter (Signed)
Patient states keeping check on A1C last seen 1/23 and needing labs done for followup

## 2017-02-11 NOTE — Telephone Encounter (Signed)
Patient with lipid, met 7 and hgba1c in January 2018

## 2017-02-12 NOTE — Telephone Encounter (Signed)
Lipid, liver, fasting glucose, A1c

## 2017-02-13 NOTE — Telephone Encounter (Signed)
Blood work ordered in EPIC. Patient notified. 

## 2017-02-21 DIAGNOSIS — R739 Hyperglycemia, unspecified: Secondary | ICD-10-CM | POA: Diagnosis not present

## 2017-02-21 DIAGNOSIS — E781 Pure hyperglyceridemia: Secondary | ICD-10-CM | POA: Diagnosis not present

## 2017-02-22 LAB — LIPID PANEL
CHOLESTEROL TOTAL: 212 mg/dL — AB (ref 100–199)
Chol/HDL Ratio: 5.2 ratio — ABNORMAL HIGH (ref 0.0–5.0)
HDL: 41 mg/dL (ref >39)
LDL CALC: 146 mg/dL — AB (ref 0–99)
Triglycerides: 123 mg/dL (ref 0–149)
VLDL Cholesterol Cal: 25 mg/dL (ref 5–40)

## 2017-02-22 LAB — HEPATIC FUNCTION PANEL
ALBUMIN: 4.7 g/dL (ref 3.5–5.5)
ALK PHOS: 70 IU/L (ref 39–117)
ALT: 32 IU/L (ref 0–44)
AST: 28 IU/L (ref 0–40)
BILIRUBIN TOTAL: 0.5 mg/dL (ref 0.0–1.2)
Bilirubin, Direct: 0.13 mg/dL (ref 0.00–0.40)
Total Protein: 7.2 g/dL (ref 6.0–8.5)

## 2017-02-22 LAB — HEMOGLOBIN A1C
ESTIMATED AVERAGE GLUCOSE: 120 mg/dL
Hgb A1c MFr Bld: 5.8 % — ABNORMAL HIGH (ref 4.8–5.6)

## 2017-02-22 LAB — GLUCOSE, RANDOM: Glucose: 100 mg/dL — ABNORMAL HIGH (ref 65–99)

## 2017-02-23 ENCOUNTER — Encounter: Payer: Self-pay | Admitting: Family Medicine

## 2018-01-09 ENCOUNTER — Telehealth: Payer: Self-pay | Admitting: Family Medicine

## 2018-01-09 NOTE — Telephone Encounter (Signed)
Please sign order for CPAP supplies so I may fax & document  In red folder in yellow box

## 2018-01-09 NOTE — Telephone Encounter (Signed)
is this was filled out thank you

## 2018-01-12 NOTE — Telephone Encounter (Signed)
Faxed order for CPAP supplies to [facility]  Sent order to be scanned, filed

## 2018-02-16 ENCOUNTER — Encounter: Payer: Self-pay | Admitting: Nurse Practitioner

## 2018-02-16 ENCOUNTER — Ambulatory Visit: Payer: 59 | Admitting: Nurse Practitioner

## 2018-02-16 VITALS — BP 128/78 | Temp 98.4°F | Ht 69.0 in | Wt 210.8 lb

## 2018-02-16 DIAGNOSIS — G4733 Obstructive sleep apnea (adult) (pediatric): Secondary | ICD-10-CM | POA: Diagnosis not present

## 2018-02-16 DIAGNOSIS — J019 Acute sinusitis, unspecified: Secondary | ICD-10-CM

## 2018-02-16 DIAGNOSIS — B9689 Other specified bacterial agents as the cause of diseases classified elsewhere: Secondary | ICD-10-CM

## 2018-02-16 MED ORDER — DOXYCYCLINE HYCLATE 100 MG PO TABS: 100.0000 mg | ORAL_TABLET | Freq: Two times a day (BID) | ORAL | 0 refills | Status: DC

## 2018-02-16 NOTE — Progress Notes (Signed)
Subjective: Presents for complaints of headache/facial area pain that began on 7/5.  Had a low-grade fever of 100.4 at that time.  Describes as a throbbing pounding pain at times.  No sore throat postnasal drainage runny nose cough or ear pain or wheezing.  No nausea vomiting.  No photosensitivity.  Mild phonophobia.  Mainly relieved with rest.  Pain worse with certain movements or coughing.  No numbness or weakness of the face arms or legs.  No difficulty speaking or swallowing.  No visual changes.  States his appetite is been normal.  Used a Neti pot with clear drainage noted.  States he is feeling much better today. No known history of recent tick bite.  Objective:   BP (!) 128/94   Temp 98.4 F (36.9 C) (Oral)   Ht 5\' 9"  (1.753 m)   Wt 210 lb 12.8 oz (95.6 kg)   BMI 31.13 kg/m  NAD.  Alert, oriented.  TMs retracted bilateral, no erythema.  Pharynx minimally erythematous, clear PND noted.  Neck supple with mild soft anterior adenopathy.  Lungs clear.  Heart regular rate and rhythm.  Minimal tenderness with percussion and palpation in the facial area around the eyes.  No temporal area tenderness noted.  Note the patient states that this was tender to touch yesterday, much improved.  Assessment:  Acute bacterial rhinosinusitis    Plan:   Meds ordered this encounter  Medications  . doxycycline (VIBRA-TABS) 100 MG tablet    Sig: Take 1 tablet (100 mg total) by mouth 2 (two) times daily.    Dispense:  20 tablet    Refill:  0    Order Specific Question:   Supervising Provider    Answer:   Maggie Font   Explained to patient that the headache has a migraine component, denies any history of migraines.  Since fever has improved and symptoms are much better today, will do a trial of doxycycline to cover sinusitis as well as the possibility of tick disease as a precaution.  Patient agrees to this plan.  Warning signs reviewed.  Patient to call back in 3 to 4 days if no improvement in  his current symptoms, call or go to ED sooner if worse.

## 2019-05-18 ENCOUNTER — Telehealth: Payer: Self-pay | Admitting: Family Medicine

## 2019-05-18 NOTE — Telephone Encounter (Signed)
Patient needs prescription for new mask and hose for Cpap machine called into [facility]

## 2019-05-18 NOTE — Telephone Encounter (Signed)
May go ahead for a mask in equipment supplies for CPAP machine Please do a prescription sign it then we can send into pharmacy Also patient should consider doing wellness exam at least on a yearly basis at his age

## 2019-05-19 NOTE — Telephone Encounter (Addendum)
Prescription for CPAP supplies faxed to pharmacy. Left message to return call to notify patient

## 2019-05-21 NOTE — Telephone Encounter (Signed)
Left message to return call 

## 2019-05-27 NOTE — Telephone Encounter (Signed)
Pt.notified

## 2019-12-02 ENCOUNTER — Telehealth: Payer: Self-pay | Admitting: Family Medicine

## 2019-12-02 DIAGNOSIS — Z0000 Encounter for general adult medical examination without abnormal findings: Secondary | ICD-10-CM

## 2019-12-02 DIAGNOSIS — Z79899 Other long term (current) drug therapy: Secondary | ICD-10-CM

## 2019-12-02 DIAGNOSIS — E781 Pure hyperglyceridemia: Secondary | ICD-10-CM

## 2019-12-02 DIAGNOSIS — Z125 Encounter for screening for malignant neoplasm of prostate: Secondary | ICD-10-CM

## 2019-12-02 NOTE — Telephone Encounter (Signed)
Last labs completed 02/21/17 A1C, Glucose (random), hepatic and lipid. Please advise. Thank you

## 2019-12-02 NOTE — Telephone Encounter (Signed)
Pt has cpe scheduled 5/7 and would like lab work done.

## 2019-12-03 NOTE — Telephone Encounter (Signed)
Lab orders placed and pt is aware 

## 2019-12-03 NOTE — Telephone Encounter (Signed)
Lipid, liver, metabolic 7, CBC, PSA °

## 2019-12-14 LAB — CBC WITH DIFFERENTIAL/PLATELET
Basophils Absolute: 0.1 10*3/uL (ref 0.0–0.2)
Basos: 1 %
EOS (ABSOLUTE): 0.2 10*3/uL (ref 0.0–0.4)
Eos: 3 %
Hematocrit: 42.1 % (ref 37.5–51.0)
Hemoglobin: 14.4 g/dL (ref 13.0–17.7)
Immature Grans (Abs): 0 10*3/uL (ref 0.0–0.1)
Immature Granulocytes: 0 %
Lymphocytes Absolute: 2.9 10*3/uL (ref 0.7–3.1)
Lymphs: 36 %
MCH: 31 pg (ref 26.6–33.0)
MCHC: 34.2 g/dL (ref 31.5–35.7)
MCV: 91 fL (ref 79–97)
Monocytes Absolute: 0.9 10*3/uL (ref 0.1–0.9)
Monocytes: 11 %
Neutrophils Absolute: 4.1 10*3/uL (ref 1.4–7.0)
Neutrophils: 49 %
Platelets: 307 10*3/uL (ref 150–450)
RBC: 4.65 x10E6/uL (ref 4.14–5.80)
RDW: 12.4 % (ref 11.6–15.4)
WBC: 8.2 10*3/uL (ref 3.4–10.8)

## 2019-12-14 LAB — BASIC METABOLIC PANEL
BUN/Creatinine Ratio: 16 (ref 9–20)
BUN: 20 mg/dL (ref 6–24)
CO2: 21 mmol/L (ref 20–29)
Calcium: 10.1 mg/dL (ref 8.7–10.2)
Chloride: 103 mmol/L (ref 96–106)
Creatinine, Ser: 1.25 mg/dL (ref 0.76–1.27)
GFR calc Af Amer: 77 mL/min/{1.73_m2} (ref >59)
GFR calc non Af Amer: 66 mL/min/{1.73_m2} (ref >59)
Glucose: 109 mg/dL — ABNORMAL HIGH (ref 65–99)
Potassium: 4.7 mmol/L (ref 3.5–5.2)
Sodium: 141 mmol/L (ref 134–144)

## 2019-12-14 LAB — LIPID PANEL
Chol/HDL Ratio: 5.5 ratio — ABNORMAL HIGH (ref 0.0–5.0)
Cholesterol, Total: 231 mg/dL — ABNORMAL HIGH (ref 100–199)
HDL: 42 mg/dL (ref >39)
LDL Chol Calc (NIH): 156 mg/dL — ABNORMAL HIGH (ref 0–99)
Triglycerides: 179 mg/dL — ABNORMAL HIGH (ref 0–149)
VLDL Cholesterol Cal: 33 mg/dL (ref 5–40)

## 2019-12-14 LAB — HEPATIC FUNCTION PANEL
ALT: 28 IU/L (ref 0–44)
AST: 22 IU/L (ref 0–40)
Albumin: 4.6 g/dL (ref 3.8–4.9)
Alkaline Phosphatase: 72 IU/L (ref 39–117)
Bilirubin Total: 0.4 mg/dL (ref 0.0–1.2)
Bilirubin, Direct: 0.09 mg/dL (ref 0.00–0.40)
Total Protein: 7.4 g/dL (ref 6.0–8.5)

## 2019-12-14 LAB — PSA: Prostate Specific Ag, Serum: 0.7 ng/mL (ref 0.0–4.0)

## 2019-12-17 ENCOUNTER — Other Ambulatory Visit: Payer: Self-pay

## 2019-12-17 ENCOUNTER — Telehealth: Payer: Self-pay | Admitting: Family Medicine

## 2019-12-17 ENCOUNTER — Encounter: Payer: Self-pay | Admitting: Family Medicine

## 2019-12-17 ENCOUNTER — Ambulatory Visit (INDEPENDENT_AMBULATORY_CARE_PROVIDER_SITE_OTHER): Payer: 59 | Admitting: Family Medicine

## 2019-12-17 VITALS — BP 140/82 | Temp 97.3°F | Ht 68.0 in | Wt 213.6 lb

## 2019-12-17 DIAGNOSIS — Z1211 Encounter for screening for malignant neoplasm of colon: Secondary | ICD-10-CM

## 2019-12-17 DIAGNOSIS — R1310 Dysphagia, unspecified: Secondary | ICD-10-CM

## 2019-12-17 DIAGNOSIS — Z122 Encounter for screening for malignant neoplasm of respiratory organs: Secondary | ICD-10-CM | POA: Diagnosis not present

## 2019-12-17 DIAGNOSIS — Z0000 Encounter for general adult medical examination without abnormal findings: Secondary | ICD-10-CM

## 2019-12-17 NOTE — Progress Notes (Signed)
Reminder placed in reminder file  

## 2019-12-17 NOTE — Telephone Encounter (Signed)
Pt would like to know if he needs to do anything to schedule the lung cancer screening

## 2019-12-17 NOTE — Patient Instructions (Signed)
DASH Eating Plan DASH stands for "Dietary Approaches to Stop Hypertension." The DASH eating plan is a healthy eating plan that has been shown to reduce high blood pressure (hypertension). It may also reduce your risk for type 2 diabetes, heart disease, and stroke. The DASH eating plan may also help with weight loss. What are tips for following this plan?  General guidelines  Avoid eating more than 2,300 mg (milligrams) of salt (sodium) a day. If you have hypertension, you may need to reduce your sodium intake to 1,500 mg a day.  Limit alcohol intake to no more than 1 drink a day for nonpregnant women and 2 drinks a day for men. One drink equals 12 oz of beer, 5 oz of wine, or 1 oz of hard liquor.  Work with your health care provider to maintain a healthy body weight or to lose weight. Ask what an ideal weight is for you.  Get at least 30 minutes of exercise that causes your heart to beat faster (aerobic exercise) most days of the week. Activities may include walking, swimming, or biking.  Work with your health care provider or diet and nutrition specialist (dietitian) to adjust your eating plan to your individual calorie needs. Reading food labels   Check food labels for the amount of sodium per serving. Choose foods with less than 5 percent of the Daily Value of sodium. Generally, foods with less than 300 mg of sodium per serving fit into this eating plan.  To find whole grains, look for the word "whole" as the first word in the ingredient list. Shopping  Buy products labeled as "low-sodium" or "no salt added."  Buy fresh foods. Avoid canned foods and premade or frozen meals. Cooking  Avoid adding salt when cooking. Use salt-free seasonings or herbs instead of table salt or sea salt. Check with your health care provider or pharmacist before using salt substitutes.  Do not fry foods. Cook foods using healthy methods such as baking, boiling, grilling, and broiling instead.  Cook with  heart-healthy oils, such as olive, canola, soybean, or sunflower oil. Meal planning  Eat a balanced diet that includes: ? 5 or more servings of fruits and vegetables each day. At each meal, try to fill half of your plate with fruits and vegetables. ? Up to 6-8 servings of whole grains each day. ? Less than 6 oz of lean meat, poultry, or fish each day. A 3-oz serving of meat is about the same size as a deck of cards. One egg equals 1 oz. ? 2 servings of low-fat dairy each day. ? A serving of nuts, seeds, or beans 5 times each week. ? Heart-healthy fats. Healthy fats called Omega-3 fatty acids are found in foods such as flaxseeds and coldwater fish, like sardines, salmon, and mackerel.  Limit how much you eat of the following: ? Canned or prepackaged foods. ? Food that is high in trans fat, such as fried foods. ? Food that is high in saturated fat, such as fatty meat. ? Sweets, desserts, sugary drinks, and other foods with added sugar. ? Full-fat dairy products.  Do not salt foods before eating.  Try to eat at least 2 vegetarian meals each week.  Eat more home-cooked food and less restaurant, buffet, and fast food.  When eating at a restaurant, ask that your food be prepared with less salt or no salt, if possible. What foods are recommended? The items listed may not be a complete list. Talk with your dietitian about   what dietary choices are best for you. Grains Whole-grain or whole-wheat bread. Whole-grain or whole-wheat pasta. Brown rice. Oatmeal. Quinoa. Bulgur. Whole-grain and low-sodium cereals. Pita bread. Low-fat, low-sodium crackers. Whole-wheat flour tortillas. Vegetables Fresh or frozen vegetables (raw, steamed, roasted, or grilled). Low-sodium or reduced-sodium tomato and vegetable juice. Low-sodium or reduced-sodium tomato sauce and tomato paste. Low-sodium or reduced-sodium canned vegetables. Fruits All fresh, dried, or frozen fruit. Canned fruit in natural juice (without  added sugar). Meat and other protein foods Skinless chicken or turkey. Ground chicken or turkey. Pork with fat trimmed off. Fish and seafood. Egg whites. Dried beans, peas, or lentils. Unsalted nuts, nut butters, and seeds. Unsalted canned beans. Lean cuts of beef with fat trimmed off. Low-sodium, lean deli meat. Dairy Low-fat (1%) or fat-free (skim) milk. Fat-free, low-fat, or reduced-fat cheeses. Nonfat, low-sodium ricotta or cottage cheese. Low-fat or nonfat yogurt. Low-fat, low-sodium cheese. Fats and oils Soft margarine without trans fats. Vegetable oil. Low-fat, reduced-fat, or light mayonnaise and salad dressings (reduced-sodium). Canola, safflower, olive, soybean, and sunflower oils. Avocado. Seasoning and other foods Herbs. Spices. Seasoning mixes without salt. Unsalted popcorn and pretzels. Fat-free sweets. What foods are not recommended? The items listed may not be a complete list. Talk with your dietitian about what dietary choices are best for you. Grains Baked goods made with fat, such as croissants, muffins, or some breads. Dry pasta or rice meal packs. Vegetables Creamed or fried vegetables. Vegetables in a cheese sauce. Regular canned vegetables (not low-sodium or reduced-sodium). Regular canned tomato sauce and paste (not low-sodium or reduced-sodium). Regular tomato and vegetable juice (not low-sodium or reduced-sodium). Pickles. Olives. Fruits Canned fruit in a light or heavy syrup. Fried fruit. Fruit in cream or butter sauce. Meat and other protein foods Fatty cuts of meat. Ribs. Fried meat. Bacon. Sausage. Bologna and other processed lunch meats. Salami. Fatback. Hotdogs. Bratwurst. Salted nuts and seeds. Canned beans with added salt. Canned or smoked fish. Whole eggs or egg yolks. Chicken or turkey with skin. Dairy Whole or 2% milk, cream, and half-and-half. Whole or full-fat cream cheese. Whole-fat or sweetened yogurt. Full-fat cheese. Nondairy creamers. Whipped toppings.  Processed cheese and cheese spreads. Fats and oils Butter. Stick margarine. Lard. Shortening. Ghee. Bacon fat. Tropical oils, such as coconut, palm kernel, or palm oil. Seasoning and other foods Salted popcorn and pretzels. Onion salt, garlic salt, seasoned salt, table salt, and sea salt. Worcestershire sauce. Tartar sauce. Barbecue sauce. Teriyaki sauce. Soy sauce, including reduced-sodium. Steak sauce. Canned and packaged gravies. Fish sauce. Oyster sauce. Cocktail sauce. Horseradish that you find on the shelf. Ketchup. Mustard. Meat flavorings and tenderizers. Bouillon cubes. Hot sauce and Tabasco sauce. Premade or packaged marinades. Premade or packaged taco seasonings. Relishes. Regular salad dressings. Where to find more information:  National Heart, Lung, and Blood Institute: www.nhlbi.nih.gov  American Heart Association: www.heart.org Summary  The DASH eating plan is a healthy eating plan that has been shown to reduce high blood pressure (hypertension). It may also reduce your risk for type 2 diabetes, heart disease, and stroke.  With the DASH eating plan, you should limit salt (sodium) intake to 2,300 mg a day. If you have hypertension, you may need to reduce your sodium intake to 1,500 mg a day.  When on the DASH eating plan, aim to eat more fresh fruits and vegetables, whole grains, lean proteins, low-fat dairy, and heart-healthy fats.  Work with your health care provider or diet and nutrition specialist (dietitian) to adjust your eating plan to your   individual calorie needs. This information is not intended to replace advice given to you by your health care provider. Make sure you discuss any questions you have with your health care provider. Document Revised: 07/11/2017 Document Reviewed: 07/22/2016 Elsevier Patient Education  2020 Elsevier Inc.  

## 2019-12-17 NOTE — Progress Notes (Signed)
Subjective:    Patient ID: Aaron Griffin, male    DOB: 02/08/1966, 54 y.o.   MRN: [id number]  HPI  The patient comes in today for a wellness visit.    A review of their health history was completed.  A review of medications was also completed.  Any needed refills; none  Eating habits: Could be better  Falls/  MVA accidents in past few months: none  Regular exercise: Golfing, working out at home regularly  Specialist pt sees on regular basis: none  Preventative health issues were discussed.   Additional concerns: Interested in lung cancer screening Results for orders placed or performed in visit on 12/02/19  Lipid Profile  Result Value Ref Range   Cholesterol, Total 231 (H) 100 - 199 mg/dL   Triglycerides 179 (H) 0 - 149 mg/dL   HDL 42 >39 mg/dL   VLDL Cholesterol Cal 33 5 - 40 mg/dL   LDL Chol Calc (NIH) 156 (H) 0 - 99 mg/dL   Chol/HDL Ratio 5.5 (H) 0.0 - 5.0 ratio  Hepatic function panel  Result Value Ref Range   Total Protein 7.4 6.0 - 8.5 g/dL   Albumin 4.6 3.8 - 4.9 g/dL   Bilirubin Total 0.4 0.0 - 1.2 mg/dL   Bilirubin, Direct 0.09 0.00 - 0.40 mg/dL   Alkaline Phosphatase 72 39 - 117 IU/L   AST 22 0 - 40 IU/L   ALT 28 0 - 44 IU/L  Basic Metabolic Panel (BMET)  Result Value Ref Range   Glucose 109 (H) 65 - 99 mg/dL   BUN 20 6 - 24 mg/dL   Creatinine, Ser 1.25 0.76 - 1.27 mg/dL   GFR calc non Af Amer 66 >59 mL/min/1.73   GFR calc Af Amer 77 >59 mL/min/1.73   BUN/Creatinine Ratio 16 9 - 20   Sodium 141 134 - 144 mmol/L   Potassium 4.7 3.5 - 5.2 mmol/L   Chloride 103 96 - 106 mmol/L   CO2 21 20 - 29 mmol/L   Calcium 10.1 8.7 - 10.2 mg/dL  CBC with Differential  Result Value Ref Range   WBC 8.2 3.4 - 10.8 x10E3/uL   RBC 4.65 4.14 - 5.80 x10E6/uL   Hemoglobin 14.4 13.0 - 17.7 g/dL   Hematocrit 42.1 37.5 - 51.0 %   MCV 91 79 - 97 fL   MCH 31.0 26.6 - 33.0 pg   MCHC 34.2 31.5 - 35.7 g/dL   RDW 12.4 11.6 - 15.4 %   Platelets 307 150 - 450 x10E3/uL   Neutrophils 49 Not Estab. %   Lymphs 36 Not Estab. %   Monocytes 11 Not Estab. %   Eos 3 Not Estab. %   Basos 1 Not Estab. %   Neutrophils Absolute 4.1 1.4 - 7.0 x10E3/uL   Lymphocytes Absolute 2.9 0.7 - 3.1 x10E3/uL   Monocytes Absolute 0.9 0.1 - 0.9 x10E3/uL   EOS (ABSOLUTE) 0.2 0.0 - 0.4 x10E3/uL   Basophils Absolute 0.1 0.0 - 0.2 x10E3/uL   Immature Granulocytes 0 Not Estab. %   Immature Grans (Abs) 0.0 0.0 - 0.1 x10E3/uL  PSA  Result Value Ref Range   Prostate Specific Ag, Serum 0.7 0.0 - 4.0 ng/mL    Review of Systems  Constitutional: Negative for diaphoresis and fatigue.  HENT: Negative for congestion and rhinorrhea.   Respiratory: Negative for cough and shortness of breath.   Cardiovascular: Negative for chest pain and leg swelling.  Gastrointestinal: Negative for abdominal pain and diarrhea.  Skin:  Negative for color change and rash.  Neurological: Negative for dizziness and headaches.  Psychiatric/Behavioral: Negative for behavioral problems and confusion.   0    Objective:   Physical Exam Vitals reviewed.  Constitutional:      General: He is not in acute distress. HENT:     Head: Normocephalic and atraumatic.  Eyes:     General:        Right eye: No discharge.        Left eye: No discharge.  Neck:     Trachea: No tracheal deviation.  Cardiovascular:     Rate and Rhythm: Normal rate and regular rhythm.     Heart sounds: Normal heart sounds. No murmur.  Pulmonary:     Effort: Pulmonary effort is normal. No respiratory distress.     Breath sounds: Normal breath sounds.  Genitourinary:    Testes: Normal.     Prostate: Normal.  Lymphadenopathy:     Cervical: No cervical adenopathy.  Skin:    General: Skin is warm and dry.  Neurological:     Mental Status: He is alert.     Coordination: Coordination normal.  Psychiatric:        Behavior: Behavior normal.           Assessment & Plan:  1. Well adult exam Adult wellness-complete.wellness physical  was conducted today. Importance of diet and exercise were discussed in detail.  In addition to this a discussion regarding safety was also covered. We also reviewed over immunizations and gave recommendations regarding current immunization needed for age.  In addition to this additional areas were also touched on including: Preventative health exams needed:  Colonoscopy referral for colonoscopy Patient also would benefit from EGD because of distal esophageal dysphagia  Patient was advised yearly wellness exam   2. Dysphagia, unspecified type Distal esophageal dysphagia referral to GI will need further work-up probable EGD  3. Encounter for screening for lung cancer Patient was counseled regarding this he is interested - Ambulatory Referral for Lung Cancer Scre  4. Screening for colon cancer Please see discussion above - Ambulatory referral to Gastroenterology  Significant hyperlipidemia counseled patient regarding healthy diet recheck lipid profile again in 5 months if not dramatically better than will need to consider statin

## 2019-12-17 NOTE — Telephone Encounter (Signed)
Pt contacted and was informed that he did not have to do anything for lung cancer screening. Informed pt that [facility] would be calling him to set up appt. Pt verbalized understanding.

## 2019-12-23 ENCOUNTER — Encounter: Payer: Self-pay | Admitting: Family Medicine

## 2020-01-28 ENCOUNTER — Encounter: Payer: Self-pay | Admitting: Internal Medicine

## 2020-02-16 ENCOUNTER — Encounter (HOSPITAL_COMMUNITY): Payer: Self-pay | Admitting: *Deleted

## 2020-02-16 NOTE — Progress Notes (Signed)
Patient was referred to our lung cancer screening program back in May. I was waiting to hear from radiology about the new recommendations and when those would take effect.  As of now, we will continue to follow old recommendations until further notice. I have informed patient that he is not eligible for program due to his age.  If he has any concerns he was instructed to follow up with is PCP.  Referring provider updated as well.

## 2020-03-17 ENCOUNTER — Ambulatory Visit (HOSPITAL_COMMUNITY)
Admission: RE | Admit: 2020-03-17 | Discharge: 2020-03-17 | Disposition: A | Discharge status: Home / Self Care | Payer: 59 | Source: Ambulatory Visit | Attending: Family Medicine | Admitting: Family Medicine

## 2020-03-17 ENCOUNTER — Other Ambulatory Visit: Payer: Self-pay

## 2020-03-17 ENCOUNTER — Ambulatory Visit (INDEPENDENT_AMBULATORY_CARE_PROVIDER_SITE_OTHER): Payer: 59 | Admitting: Family Medicine

## 2020-03-17 DIAGNOSIS — B349 Viral infection, unspecified: Secondary | ICD-10-CM

## 2020-03-17 DIAGNOSIS — R079 Chest pain, unspecified: Secondary | ICD-10-CM

## 2020-03-17 DIAGNOSIS — J22 Unspecified acute lower respiratory infection: Secondary | ICD-10-CM

## 2020-03-17 NOTE — Progress Notes (Signed)
   Subjective:    Patient ID: Aaron Griffin, male    DOB: Dec 17, 1965, 54 y.o.   MRN: [id number]  HPIpt walked in office having pain on right side of chest. Body aches and fatigue. Started 3 days ago. Went to [facility] today and had a rapid covid test that pt states was negative.  This patient relates potential exposure to sickness from another person who is ill but does not think he was exposed to Covid he relates some body aches and fatigue.  He relates a sharp pain on the right side of his chest denies high fever chills sweats   Review of Systems  Constitutional: Negative for activity change, appetite change and fatigue.  HENT: Negative for congestion and rhinorrhea.   Respiratory: Negative for cough and shortness of breath.   Cardiovascular: Negative for chest pain and leg swelling.  Gastrointestinal: Negative for abdominal pain, nausea and vomiting.  Neurological: Negative for dizziness and headaches.  Psychiatric/Behavioral: Negative for agitation and behavioral problems.       Objective:   Physical Exam Vitals reviewed.  Constitutional:      General: He is not in acute distress. HENT:     Head: Normocephalic and atraumatic.  Eyes:     General:        Right eye: No discharge.        Left eye: No discharge.  Neck:     Trachea: No tracheal deviation.  Cardiovascular:     Rate and Rhythm: Normal rate and regular rhythm.     Heart sounds: Normal heart sounds. No murmur heard.   Pulmonary:     Effort: Pulmonary effort is normal. No respiratory distress.     Breath sounds: Normal breath sounds.  Lymphadenopathy:     Cervical: No cervical adenopathy.  Skin:    General: Skin is warm and dry.  Neurological:     Mental Status: He is alert.     Coordination: Coordination normal.  Psychiatric:        Behavior: Behavior normal.     No respiratory distress lungs sound really good.  O2 saturation very good      Assessment & Plan:  Viral syndrome Flulike illness Some  right-sided chest pain Will need to have stat chest x-ray Possible Covid Covid test taken Await results no work today O2 sat 99 Warning signs discussed

## 2020-03-18 LAB — SPECIMEN STATUS REPORT

## 2020-03-18 LAB — SARS-COV-2, NAA 2 DAY TAT

## 2020-03-18 LAB — NOVEL CORONAVIRUS, NAA: SARS-CoV-2, NAA: NOT DETECTED

## 2020-03-18 MED ORDER — AMOXICILLIN-POT CLAVULANATE 875-125 MG PO TABS: 1.0000 | ORAL_TABLET | Freq: Two times a day (BID) | ORAL | 0 refills | Status: DC

## 2020-03-19 ENCOUNTER — Telehealth: Payer: Self-pay | Admitting: Family Medicine

## 2020-03-19 NOTE — Telephone Encounter (Signed)
Please let me speak with pulmonologist [facility] office) for clinical question and referral regarding abnormal x-ray with bulla that has fluid in it in the setting of infection  I believe the pulmonary doctors are in on the afternoon if they could call me on my cell phone Monday afternoon that would be great [phone] Thank you

## 2020-03-20 ENCOUNTER — Telehealth: Payer: Self-pay | Admitting: Family Medicine

## 2020-03-20 ENCOUNTER — Telehealth: Payer: Self-pay | Admitting: Pulmonary Disease

## 2020-03-20 DIAGNOSIS — J439 Emphysema, unspecified: Secondary | ICD-10-CM

## 2020-03-20 NOTE — Telephone Encounter (Signed)
Contacted [facility] in [place] office number has been disconnected). Left message to contact [place] on cell phone.

## 2020-03-20 NOTE — Telephone Encounter (Signed)
Nurses I did discuss the case with pulmonary NP. Patient was aware that we would be setting this up  Go ahead with referral to the pulmonary [place] office Let the patient know that their office will reach out to him early this week tell him when to be seen they are hoping to work him in either this week or next week  Go ahead with scheduling CT scan of the chest without contrast due to bulla with air-fluid level plus also respiratory infection. This CT scan needs to be completed this week. Please schedule Please notify patient thanks

## 2020-03-20 NOTE — Telephone Encounter (Signed)
Per chart review patient is already on Augmentin from PCP.  We will have patient continue on these antibiotics.  Can consider additional course or extended course after evaluation by Dr. Melvyn Novas on 03/24/2020.  We will route to Dr. Melvyn Novas is FYI.  Wyn Quaker, FNP

## 2020-03-20 NOTE — Telephone Encounter (Signed)
Called and spoke with patient and set up a consult appointment for 03/24/2020 at 11:30am per Dr Melvyn Novas. Patient agreed and aware of appointment. Writer called and spoke with Tammy at Dr Lance Sell office and updated them on appointment set up.

## 2020-03-20 NOTE — Telephone Encounter (Signed)
Referral placed and CT Chest without contrast also placed.

## 2020-03-20 NOTE — Telephone Encounter (Signed)
03/20/2020  Received telephone call in clinic today from Dr. Wolfgang Phoenix regarding patient.  Patient was seen on 03/17/2020 for viral syndrome.  Covid test negative.  Recent chest x-ray from 03/17/2020 shows a bulla with an air-fluid level.  Known bulla seen on 2016 CT of chest.  Dr. Wolfgang Phoenix was wondering what next best steps would be for the patient if we could get the patient seen in our office.  We will route message to Dr. Melvyn Novas.  I have notified Dr. Melvyn Novas that I am sending the telephone call.  We will also route to his nurse of the day Willeen Cass, RN.  I have instructed Dr. Wolfgang Phoenix to go ahead and place an order for a CT chest.  We can work on coordinating getting the patient seen in the [place] clinic sometime over the next week if Dr. Melvyn Novas has the capabilities.  In the meantime likely do need to treat patient acutely with antibiotics for suspected bulla infection.  Would favor Augmentin at this time.  No documented previous history of Pseudomonas infections.  We will route message to Dr. Melvyn Novas, Willeen Cass, RN as well as Dr. Wolfgang Phoenix to keep him in the loop.  Wyn Quaker, FNP

## 2020-03-20 NOTE — Telephone Encounter (Signed)
I did discuss with NP see other notes

## 2020-03-20 NOTE — Telephone Encounter (Signed)
Aaron Griffin received a appt from [facility] for Friday at 11:30. Hospital has called and needs the referral sent. Check with [place] and no order has been sent to her to send.

## 2020-03-20 NOTE — Addendum Note (Signed)
Addended by: Vicente Males on: 03/20/2020 02:03 PM   Modules accepted: Orders

## 2020-03-20 NOTE — Telephone Encounter (Signed)
Work pt in this week and let Dr Lance Sell office know

## 2020-03-20 NOTE — Telephone Encounter (Signed)
CT order and referral placed.

## 2020-03-21 ENCOUNTER — Other Ambulatory Visit: Payer: Self-pay

## 2020-03-21 ENCOUNTER — Ambulatory Visit (AMBULATORY_SURGERY_CENTER): Payer: Self-pay

## 2020-03-21 VITALS — Ht 68.0 in | Wt 216.2 lb

## 2020-03-21 DIAGNOSIS — Z1211 Encounter for screening for malignant neoplasm of colon: Secondary | ICD-10-CM

## 2020-03-21 NOTE — Progress Notes (Signed)
No allergies to soy or egg Pt is not on blood thinners or diet pills Denies issues with sedation/intubation Denies atrial flutter/fib Denies constipation   Pt is aware of Covid safety and care partner requirements.  Pt declined Sutab, Suprep not covered by insurance and not willing for any extra cost.  Opted for HD Mira.  During course of visit, pt stated that he was supposed to have an EGD as well.  Explained to him that to do both he would need to be seen by the provider.  Pt agreed.    Appt scheduled with Roe [facility] for [facility] 9/22 at 11:50.    Pre-vist not completed d/t the lateness of the OV and not able to discern if procedure would be scheduled within the next 2 months

## 2020-03-23 ENCOUNTER — Telehealth: Payer: Self-pay | Admitting: Family Medicine

## 2020-03-23 ENCOUNTER — Ambulatory Visit (HOSPITAL_COMMUNITY)
Admission: RE | Admit: 2020-03-23 | Discharge: 2020-03-23 | Disposition: A | Discharge status: Home / Self Care | Payer: 59 | Source: Ambulatory Visit | Attending: Family Medicine | Admitting: Family Medicine

## 2020-03-23 ENCOUNTER — Encounter: Payer: Self-pay | Admitting: Nurse Practitioner

## 2020-03-23 ENCOUNTER — Other Ambulatory Visit: Payer: Self-pay

## 2020-03-23 DIAGNOSIS — J439 Emphysema, unspecified: Secondary | ICD-10-CM | POA: Diagnosis not present

## 2020-03-23 NOTE — Telephone Encounter (Signed)
I did discuss the CAT scan with the patient Also discussed aortic atherosclerosis Start Crestor 10 mg daily, #30, 1 daily, and 4 refills  Lipid liver profile in 8 to 12 weeks with follow-up office visit Please send his medication into his pharmacy plus also print off lab papers mailed him to the patient with a reminder for him to do his labs in 8 to 12 weeks with a follow-up office visit

## 2020-03-23 NOTE — Telephone Encounter (Signed)
Pt returning Dr. Bary Leriche call

## 2020-03-24 ENCOUNTER — Other Ambulatory Visit: Payer: Self-pay | Admitting: *Deleted

## 2020-03-24 ENCOUNTER — Encounter: Payer: Self-pay | Admitting: Internal Medicine

## 2020-03-24 ENCOUNTER — Ambulatory Visit (INDEPENDENT_AMBULATORY_CARE_PROVIDER_SITE_OTHER): Payer: 59 | Admitting: Internal Medicine

## 2020-03-24 DIAGNOSIS — E781 Pure hyperglyceridemia: Secondary | ICD-10-CM

## 2020-03-24 DIAGNOSIS — J439 Emphysema, unspecified: Secondary | ICD-10-CM | POA: Insufficient documentation

## 2020-03-24 DIAGNOSIS — Z79899 Other long term (current) drug therapy: Secondary | ICD-10-CM

## 2020-03-24 MED ORDER — AMOXICILLIN-POT CLAVULANATE 875-125 MG PO TABS: 1.0000 | ORAL_TABLET | Freq: Two times a day (BID) | ORAL | 0 refills | Status: AC

## 2020-03-24 MED ORDER — ROSUVASTATIN CALCIUM 10 MG PO TABS
10.0000 mg | ORAL_TABLET | Freq: Every day | ORAL | 4 refills | Status: DC
Start: 2020-03-24 — End: 2020-07-03

## 2020-03-24 NOTE — Progress Notes (Signed)
Aaron Griffin, male    DOB: 11/12/1965,    MRN: [id number]   Brief patient profile:  54 yowm quit smoking in 2016 p incidental discovery of RUL cyst p flu like illess which resolved and back to perfect health including eliptical x one hour then acutely ill around 03/14/20 pain on deep breathing/ fatigue and started on augmentin 03/18/20 x 10 days and  and 90% better by 03/24/2020 when referred to pulmonary clinic in [place]  03/24/2020 by Dr    Wolfgang Phoenix      History of Present Illness  03/24/2020  Pulmonary/ 1st office eval/Brityn Mastrogiovanni  Chief Complaint  Patient presents with  . Pulmonary Consult    Referred by Dr Sallee Lange.  Pt states seen Dr Luan Pulling in the past for pulmonary blep. He is getting over flulike illness now and is currently on augmentin per Dr Wolfgang Phoenix. He states not having any SOB or cough.     Dyspnea:  Not limited by breathing from desired activities   Cough none now/ never purulent or bloody  Sleep: fine flat  SABA use: none  No obvious day to day or daytime variability or assoc excess/ purulent sputum or mucus plugs or hemoptysis or cp or chest tightness, subjective wheeze or overt sinus or hb symptoms.   Sleeping  without nocturnal  or early am exacerbation  of respiratory  c/o's or need for noct saba. Also denies any obvious fluctuation of symptoms with weather or environmental changes or other aggravating or alleviating factors except as outlined above   No unusual exposure hx or h/o childhood pna/ asthma or knowledge of premature birth.  Current Allergies, Complete Past Medical History, Past Surgical History, Family History, and Social History were reviewed in [employer] record.  ROS  The following are not active complaints unless bolded Hoarseness, sore throat, dysphagia, dental problems, itching, sneezing,  nasal congestion or discharge of excess mucus or purulent secretions, ear ache,   fever, chills, sweats, unintended wt loss or wt gain,  classically pleuritic or exertional cp,  orthopnea pnd or arm/hand swelling  or leg swelling, presyncope, palpitations, abdominal pain, anorexia, nausea, vomiting, diarrhea  or change in bowel habits or change in bladder habits, change in stools or change in urine, dysuria, hematuria,  rash, arthralgias, visual complaints, headache, numbness, weakness or ataxia or problems with walking or coordination,  change in mood or  memory.           Past Medical History:  Diagnosis Date  . Sleep apnea    cpap    Outpatient Medications Prior to Visit  Medication Sig Dispense Refill  . amoxicillin-clavulanate (AUGMENTIN) 875-125 MG tablet Take 1 tablet by mouth 2 (two) times daily. 20 tablet 0  . diphenhydrAMINE HCl, Sleep, (ZZZQUIL PO) Take by mouth.    . rosuvastatin (CRESTOR) 10 MG tablet Take 1 tablet (10 mg total) by mouth daily. (Patient not taking: Reported on 03/24/2020) 30 tablet 4   No facility-administered medications prior to visit.     Objective:     BP 104/64 (BP Location: Left Arm, Cuff Size: Normal)   Pulse 82   Temp 97.6 F (36.4 C) (Temporal)   Ht 5\' 9"  (1.753 m)   Wt 212 lb (96.2 kg)   SpO2 99% Comment: on RA  BMI 31.31 kg/m   SpO2: 99 % (on RA)     HEENT : pt wearing mask not removed for exam due to covid -19 concerns.    NECK :  without JVD/Nodes/TM/ nl carotid upstrokes bilaterally   LUNGS: no acc muscle use,  Nl contour chest which is clear to A and P bilaterally without cough on insp or exp maneuvers   CV:  RRR  no s3 or murmur or increase in P2, and no edema   ABD:  soft and nontender with nl inspiratory excursion in the supine position. No bruits or organomegaly appreciated, bowel sounds nl  MS:  Nl gait/ ext warm without deformities, calf tenderness, cyanosis or clubbing No obvious joint restrictions   SKIN: warm and dry without lesions    NEURO:  alert, approp, nl sensorium with  no motor or cerebellar deficits apparent.    I personally reviewed  images and agree with radiology impression as follows:   Chest CT  W/o contrast 03/23/20  1. Redemonstrated bulla within the medial aspect of the right upper lobe. Overall this appears slightly decreased in size when compared to prior exam however now contains layering fluid. The wall appears thicker. Findings are nonspecific however may be secondary to an infectious/inflammatory process. 2. Interval development of patchy ground-glass opacities throughout the medial aspect of the right upper lobe which may be secondary to an infectious/inflammatory process. Recommend follow-up chest CT in 3 months to assess for interval resolution. 3. 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  Clearly infected clinically/ radiographically since around 03/14/20   >>>>  rx  augmentin 03/18/20 and improved clnically 03/24/2020 at  [facility]   First noted in 2016 but not entirely clear this is a bulla vs bronchogenic or mediastinal cyst and appears to be spilling contents to the other segments of the RUL in pt who is a good surgical candidate so rec  >>> complete 20 days augmentin / f/u T surgery consult at 4 weeks and no need to return here if being followed there - other wise need to be back in 3 months to regroup/ sooner prn  Discussed in detail all the  indications, usual  risks and alternatives  relative to the benefits with patient who agrees to proceed with w/u as outlined.             Each maintenance medication was reviewed in detail including emphasizing most importantly the difference between maintenance and prns and under what circumstances the prns are to be triggered using an action plan format where appropriate.  Total time for H and P, chart review, counseling, teaching device and generating customized AVS unique to this office visit / charting = 60 min           Christinia Gully, MD 03/24/2020

## 2020-03-24 NOTE — Patient Instructions (Signed)
Take a total of 20 days of Augmentin (10 plus 10)   I will be referring you to Dr Roxan Hockey at 4 weeks to discuss possible surgical removal

## 2020-03-24 NOTE — Assessment & Plan Note (Addendum)
Clearly infected clinically/ radiographically since around 03/14/20   >>>>  rx  augmentin 03/18/20 and improved clnically 03/24/2020 at  [facility]   First noted in 2016 but not entirely clear this is a bulla vs bronchogenic or mediastinal cyst and appears to be spilling contents to the other segments of the RUL in pt who is a good surgical candidate so rec  >>> complete 20 days augmentin / f/u T surgery consult at 4 weeks and no need to return here if being followed there - other wise need to be back in 3 months to regroup/ sooner prn  Discussed in detail all the  indications, usual  risks and alternatives  relative to the benefits with patient who agrees to proceed with w/u as outlined.             Each maintenance medication was reviewed in detail including emphasizing most importantly the difference between maintenance and prns and under what circumstances the prns are to be triggered using an action plan format where appropriate.  Total time for H and P, chart review, counseling, teaching device and generating customized AVS unique to this office visit / charting = 60 min

## 2020-03-24 NOTE — Telephone Encounter (Signed)
Med sent to pharm. Bw orders put in and mailed to pt with a note to do in 2- 3 months with a follow up visit.

## 2020-04-04 ENCOUNTER — Encounter: Payer: 59 | Admitting: Internal Medicine

## 2020-04-11 ENCOUNTER — Other Ambulatory Visit: Payer: Self-pay

## 2020-04-11 ENCOUNTER — Other Ambulatory Visit: Payer: Self-pay | Admitting: Thoracic Surgery (Cardiothoracic Vascular Surgery)

## 2020-04-11 ENCOUNTER — Institutional Professional Consult (permissible substitution) (INDEPENDENT_AMBULATORY_CARE_PROVIDER_SITE_OTHER): Payer: 59 | Admitting: Thoracic Surgery (Cardiothoracic Vascular Surgery)

## 2020-04-11 ENCOUNTER — Encounter: Payer: Self-pay | Admitting: Thoracic Surgery (Cardiothoracic Vascular Surgery)

## 2020-04-11 VITALS — BP 120/80 | HR 77 | Temp 98.2°F | Resp 16 | Ht 69.0 in | Wt 212.0 lb

## 2020-04-11 DIAGNOSIS — J439 Emphysema, unspecified: Secondary | ICD-10-CM

## 2020-04-11 NOTE — Progress Notes (Signed)
PCP is Kathyrn Drown, MD Referring Provider is Tanda Rockers, MD  Chief Complaint  Patient presents with  . Emphysematous Bleb/bulla    new eval with CT CHEST 03/23/20    HPI: Aaron Griffin is sent for consultation regarding a possible infected bulla.  Aaron Griffin is a 54 year old man with a history of a giant bulla of the right lung, sleep apnea, remote tobacco use, and aortic atherosclerosis on CT.  Aaron Griffin recently was feeling tired and noted pain in his right anterior chest with deep breathing.  Aaron Griffin was not having any fevers, chills, or night sweats.  His cough was not productive.  Aaron Griffin had a chest x-ray which showed an air-fluid level in his known right lung bulla.  Aaron Griffin was treated with an initial 10-day course of Augmentin.  A CT of the chest showed an air-fluid level in the right lung bulla, which actually was smaller in size although it did now have thickened walls.  There also was an infiltrate in the right upper lobe adjacent to the bulla.  Aaron Griffin was referred to Dr. Melvyn Novas.  Aaron Griffin was given a prescription for an additional 10 days of Augmentin but only took 7 of those tablets.  Aaron Griffin says Aaron Griffin had trouble swallowing them.  Since treatment with antibiotics Aaron Griffin has had improvement of his fatigue and his chest pain.  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  Zubrod Score: At the time of surgery this patient's most appropriate activity status/level should be described as: [x]     0    Normal activity, no symptoms []     1    Restricted in physical strenuous activity but ambulatory, able to do out light work []     2    Ambulatory and capable of self care, unable to do work activities, up and about >50 % of waking hours                              []     3    Only limited self care, in bed greater than 50% of waking hours []     4    Completely disabled, no self care, confined to bed or chair []     5    Moribund  Past Medical  History:  Diagnosis Date  . Sleep apnea    cpap    Past Surgical History:  Procedure Laterality Date  . WISDOM TOOTH EXTRACTION      Family History  Problem Relation Age of Onset  . Diabetes Mother   . Diabetes Father   . Colon cancer Neg Hx   . Colon polyps Neg Hx   . Esophageal cancer Neg Hx   . Rectal cancer Neg Hx   . Stomach cancer Neg Hx     Social History Social History   Tobacco Use  . Smoking status: Former Smoker    Packs/day: 1.50    Years: 30.00    Pack years: 45.00    Types: Cigarettes    Quit date: 03/06/2015    Years since quitting: 5.1  . Smokeless tobacco: Never Used  Vaping Use  . Vaping Use: Never used  Substance Use Topics  . Alcohol use: No    Alcohol/week: 0.0 standard drinks  . Drug use: No    Current Outpatient Medications  Medication Sig Dispense Refill  . diphenhydrAMINE  HCl, Sleep, (ZZZQUIL PO) Take by mouth.    . rosuvastatin (CRESTOR) 10 MG tablet Take 1 tablet (10 mg total) by mouth daily. 30 tablet 4   No current facility-administered medications for this visit.    No Known Allergies  Review of Systems  Constitutional: Positive for fatigue. Negative for activity change, fever and unexpected weight change.  HENT: Positive for dental problem (dentures). Negative for trouble swallowing and voice change.   Respiratory: Negative for shortness of breath.   Cardiovascular: Negative for chest pain and leg swelling.  Gastrointestinal: Negative for abdominal distention and abdominal pain.  Genitourinary: Negative for difficulty urinating and dysuria.  Musculoskeletal: Negative for arthralgias and myalgias.  Neurological: Negative for syncope and weakness.  Hematological: Negative for adenopathy. Does not bruise/bleed easily.  All other systems reviewed and are negative.   BP 120/80 (BP Location: Right Arm, Patient Position: Sitting, Cuff Size: Normal)   Pulse 77   Temp 98.2 F (36.8 C)   Resp 16   Ht 5\' 9"  (1.753 m)   Wt 212  lb (96.2 kg)   SpO2 97% Comment: RA  BMI 31.31 kg/m  Physical Exam Vitals reviewed.  Constitutional:      General: Aaron Griffin is not in acute distress.    Appearance: Normal appearance.  HENT:     Head: Normocephalic and atraumatic.  Eyes:     General: No scleral icterus.    Extraocular Movements: Extraocular movements intact.  Cardiovascular:     Rate and Rhythm: Normal rate and regular rhythm.     Heart sounds: No murmur heard.  No friction rub. No gallop.   Pulmonary:     Effort: Pulmonary effort is normal. No respiratory distress.     Breath sounds: Normal breath sounds. No wheezing or rales.  Abdominal:     General: There is no distension.     Palpations: Abdomen is soft.     Tenderness: There is no abdominal tenderness.  Musculoskeletal:     Cervical back: Neck supple.  Lymphadenopathy:     Cervical: No cervical adenopathy.  Skin:    General: Skin is warm and dry.  Neurological:     General: No focal deficit present.     Mental Status: Aaron Griffin is alert and oriented to person, place, and time.     Cranial Nerves: No cranial nerve deficit.     Motor: No weakness.     Gait: Gait normal.    Diagnostic Tests: CT CHEST WITHOUT CONTRAST  TECHNIQUE: Multidetector CT imaging of the chest was performed following the standard protocol without IV contrast.  COMPARISON:  Chest radiograph 03/17/2020; chest CT 02/21/2015  FINDINGS: Cardiovascular: Normal heart size. Trace fluid superior pericardial recess. Thoracic aortic vascular calcifications.  Mediastinum/Nodes: No enlarged axillary, mediastinal or hilar lymphadenopathy. Normal appearance of the esophagus.  Lungs/Pleura: Central airways are patent. Dependent atelectasis within the bilateral lower lobes. Biapical pleuroparenchymal thickening/scarring. Emphysematous change. Redemonstrated bulla within the medial aspect of the right upper lobe. Overall this appears slightly decreased in size when compared to prior  exam however now contains layering fluid. The wall appears thicker (image 84; series 4). Interval development of patchy ground-glass opacities throughout the medial aspect of the right upper lobe.  Upper Abdomen: No acute process.  Musculoskeletal: Thoracic spine degenerative changes. No aggressive or acute appearing osseous lesions.  IMPRESSION: 1. Redemonstrated bulla within the medial aspect of the right upper lobe. Overall this appears slightly decreased in size when compared to prior exam however now contains layering fluid.  The wall appears thicker. Findings are nonspecific however may be secondary to an infectious/inflammatory process. 2. Interval development of patchy ground-glass opacities throughout the medial aspect of the right upper lobe which may be secondary to an infectious/inflammatory process. Recommend follow-up chest CT in 3 months to assess for interval resolution. 3. Emphysema and aortic atherosclerosis.   Electronically Signed   By: Lovey Newcomer M.D.   On: 03/23/2020 11:16 I personally reviewed the CT images and concur with the findings noted above  Impression: Aaron Griffin is a 54 year old man with a history of tobacco abuse (quit 5 years ago), giant bulla of the right lung, sleep apnea, and recently noted aortic atherosclerosis.  Aaron Griffin presented with general malaise and right-sided pleuritic chest pain.  Work-up revealed an air-fluid level in a pre-existing large right bulla.  The cavity had a thick wall and there was an infiltrate in the right upper lobe adjacent to this structure..  This most likely is not infected a giant bulla in the right lung.  I concur with Dr. Melvyn Novas that resection is indicated.  I do think we need to wait and allow this infection to cool off if possible prior to surgery to decrease the morbidity of the procedure.  Aaron Griffin has improved clinically with antibiotics.  Aaron Griffin did not complete his entire course of antibiotics but did take Augmentin  for 17 days.  Hopefully that will be enough to completely treat the infection.  There is some risk that it could recur.  Aaron Griffin knows to notify us immediately if Aaron Griffin were to start feeling the malaise or pain again.  Aaron Griffin had a lot of questions about possible surgery.  I discussed this issue with Mr. Mrs. Aaron Griffin.  Ideally we like to try to do this with a minimally invasive approach.  That is much more likely after the infection has calm down.  With acute infection, Aaron Griffin is much more likely to need a thoracotomy.  I informed him of the general nature of the procedure regardless of the approach including resection of the bulla and closure of the source.  There is always a risk of a bronchopleural fistula.  That risk is much higher in the setting of an acute infection.  They understand the risks of the surgery include, but are not limited to death, MI, DVT, PE, bleeding, possible need for transfusion, infection, prolonged air leak, cardiac arrhythmias, as well as possibility of other unforeseeable complications.  They understand there is a possibility of chronic pain which is much higher if a thoracotomy is necessary.  Sleep apnea-CPAP machine is a potential source of the infection.  Aortic atherosclerosis-recently started on Crestor by Dr. Wolfgang Phoenix.  Plan: Return in 1 month with repeat CT (6 weeks from initial scan)  I spent 45 minutes in review of images, review of records, and consultation with Mr. Trudo today Melrose Nakayama, MD [facility] [phone]

## 2020-05-03 ENCOUNTER — Other Ambulatory Visit: Payer: Self-pay | Admitting: Thoracic Surgery (Cardiothoracic Vascular Surgery)

## 2020-05-03 ENCOUNTER — Ambulatory Visit: Payer: 59 | Admitting: Nurse Practitioner

## 2020-05-09 ENCOUNTER — Ambulatory Visit
Admission: RE | Admit: 2020-05-09 | Discharge: 2020-05-09 | Disposition: A | Discharge status: Home / Self Care | Payer: 59 | Source: Ambulatory Visit | Attending: Thoracic Surgery (Cardiothoracic Vascular Surgery) | Admitting: Thoracic Surgery (Cardiothoracic Vascular Surgery)

## 2020-05-09 ENCOUNTER — Ambulatory Visit: Payer: 59 | Admitting: Thoracic Surgery (Cardiothoracic Vascular Surgery)

## 2020-05-09 ENCOUNTER — Other Ambulatory Visit: Payer: Self-pay

## 2020-05-09 VITALS — BP 160/80 | HR 90 | Temp 99.5°F | Resp 20 | Ht 69.0 in | Wt 215.0 lb

## 2020-05-09 DIAGNOSIS — J439 Emphysema, unspecified: Secondary | ICD-10-CM

## 2020-05-09 NOTE — Progress Notes (Signed)
[address]       [place] [address]             [phone]     HPI: Mr. Guerreiro returns for follow-up of a giant bulla in the right lung  Aaron Griffin is a 54 year old man with a history of remote tobacco use, sleep apnea, aortic atherosclerosis, and a giant bulla of the right lung.  He presented in August with malaise and right anterior chest pain that was pleuritic in nature.  Chest x-ray showed an air-fluid level in his right lung bulla.  A CT of the chest showed an air-fluid level in the bulla along with an infiltrate in the right upper lobe.  He was treated with a total of 17 days of Augmentin.  He stopped that course early because he was having trouble swallowing the pills.  He was referred for consideration for resection of the bulla.  I recommended that we wait until 6 weeks and repeat a CT to check on the pneumonia and allow for some of the inflammation to die down.  In the interim since his last visit he has been feeling well.  He is at work and not having any shortness of breath, fevers, chills, sweats, cough, or pleuritic chest pain.  He smoked about 1.5 packs/day for 30 years before quitting 5 years ago.  Zubrod Score: At the time of surgery this patients most appropriate activity status/level should be described as: [x]     0    Normal activity, no symptoms []     1    Restricted in physical strenuous activity but ambulatory, able to do out light work []     2    Ambulatory and capable of self care, unable to do work activities, up and about >50 % of waking hours                              []     3    Only limited self care, in bed greater than 50% of waking hours []     4    Completely disabled, no self care, confined to bed or chair []     5    Moribund Past Medical History:  Diagnosis Date   Bulla of lung ([place])    Emphysema lung ([place])    Sleep apnea    cpap   Past Surgical History:  Procedure Laterality Date   WISDOM TOOTH EXTRACTION       Current Outpatient Medications  Medication Sig Dispense Refill   diphenhydrAMINE HCl, Sleep, (ZZZQUIL PO) Take by mouth.     rosuvastatin (CRESTOR) 10 MG tablet Take 1 tablet (10 mg total) by mouth daily. 30 tablet 4   No current facility-administered medications for this visit.    Physical Exam BP (!) 160/80    Pulse 90    Temp 99.5 F (37.5 C) (Skin)    Resp 20    Ht 5\' 9"  (1.753 m)    Wt 215 lb (97.5 kg)    SpO2 98% Comment: RA   BMI 31.43 kg/m  54 year old man in no acute distress Well-developed and well-nourished Alert and oriented x3 with no focal deficits Cardiac regular rate and rhythm with normal S1 and S2 with no rubs murmurs or gallops Lungs clear with equal breath sounds bilaterally, no rales or wheezing Abdomen soft nontender No clubbing cyanosis or edema  Diagnostic Tests: CT CHEST WITHOUT  CONTRAST  TECHNIQUE: Multidetector CT imaging of the chest was performed following the standard protocol without IV contrast.  COMPARISON:  03/23/2020 and 02/21/2015.  FINDINGS: Cardiovascular: Aortic and branch vessel atherosclerosis. Tortuous thoracic aorta. Normal heart size, without pericardial effusion.  Mediastinum/Nodes: No mediastinal or definite hilar adenopathy, given limitations of unenhanced CT.  Lungs/Pleura: No pleural fluid. Moderate paraseptal and mild centrilobular emphysema.  The right upper lobe ground-glass opacities have resolved.  Again identified is a right upper lobe dominant bulla/bleb. Measures on the order of 5.5 x 2.2 cm on 82/8. Decreased from 6.6 x 3.9 cm on the prior exam (when remeasured).  Significant decrease in dependent layering fluid, including on 84/8. Improvement in wall thickening.  Upper Abdomen: Low-density left hepatic lobe subcentimeter lesions are likely cysts. Normal imaged portions of the spleen, stomach, pancreas, adrenal glands, kidneys, gallbladder.  Musculoskeletal: Superior endplate compression  deformity at T6 is similar, mild.  IMPRESSION: 1. Resolution of previously described right upper lobe ground-glass opacity, likely infection. 2. Improved appearance of the dominant right-sided bulla/bleb, with decreased layering fluid and wall thickening. Likely resolving superinfection. 3. Aortic atherosclerosis (ICD10-I70.0) and emphysema (ICD10-J43.9).   Electronically Signed   By: Abigail Miyamoto M.D.   On: 05/09/2020 15:36 I personally reviewed the CT images and concur with the findings noted above  Impression: Aaron Griffin is a 54 year old man with a history of remote tobacco use, sleep apnea, aortic atherosclerosis, and a giant bulla of the right lung.  He recently had an infection of the bulla and pneumonia of the surrounding lung in the right upper lobe.  That was treated with antibiotics.  He had a good clinical response.  His CT today shows an excellent radiographic response as well.  I reviewed the images with Mr. and Mrs. Calzadilla.  We discussed options of surgical resection versus conservative management.  In my opinion, he is highly likely to have recurrent infections in that bulla.  It may be that those infections can be treated with oral antibiotics as this most recent one was, but there is always the risk that he could become seriously ill.  They do understand there is no way to know for sure.  I recommended that we go ahead and resect that bulla along with the apical blebs in the right lung.   I discussed the proposed surgical procedure with them.  We would plan to do a robotic approach for resection of the bulla and blebs.  They do understand it might be necessary to convert to a thoracotomy if there is extensive scarring in the chest.  I informed them of the general nature of the procedure including the incisions to be used, the need for general anesthesia, the use of drains to postoperatively, the expected hospital stay, and the overall recovery.  I informed him of the  indications, risks, benefits, and alternatives.  They understand the risks include, but not limited to death, MI, DVT, PE, bleeding, possible need for transfusion, infection, prolonged air leak, cardiac arrhythmias, as well as possibility of other unforeseeable complications.  He wishes to proceed.  Plan: Robotic right VATS for resection of bulla and blebs, possible thoracotomy on Thursday, 05/18/2020  Melrose Nakayama, MD [facility] [phone]

## 2020-05-09 NOTE — H&P (View-Only) (Signed)
[address]       [place] [address]             [phone]     HPI: [name] returns for follow-up of a giant bulla in the right lung  Aaron Griffin is a 54 year old man with a history of remote tobacco use, sleep apnea, aortic atherosclerosis, and a giant bulla of the right lung.  He presented in August with malaise and right anterior chest pain that was pleuritic in nature.  Chest x-ray showed an air-fluid level in his right lung bulla.  A CT of the chest showed an air-fluid level in the bulla along with an infiltrate in the right upper lobe.  He was treated with a total of 17 days of Augmentin.  He stopped that course early because he was having trouble swallowing the pills.  He was referred for consideration for resection of the bulla.  I recommended that we wait until 6 weeks and repeat a CT to check on the pneumonia and allow for some of the inflammation to die down.  In the interim since his last visit he has been feeling well.  He is at work and not having any shortness of breath, fevers, chills, sweats, cough, or pleuritic chest pain.  He smoked about 1.5 packs/day for 30 years before quitting 5 years ago.  Zubrod Score: At the time of surgery this patient's most appropriate activity status/level should be described as: [x]     0    Normal activity, no symptoms []     1    Restricted in physical strenuous activity but ambulatory, able to do out light work []     2    Ambulatory and capable of self care, unable to do work activities, up and about >50 % of waking hours                              []     3    Only limited self care, in bed greater than 50% of waking hours []     4    Completely disabled, no self care, confined to bed or chair []     5    Moribund Past Medical History:  Diagnosis Date  . Bulla of lung ([place])   . Emphysema lung ([place])   . Sleep apnea    cpap   Past Surgical History:  Procedure Laterality Date  . WISDOM TOOTH EXTRACTION       Current Outpatient Medications  Medication Sig Dispense Refill  . diphenhydrAMINE HCl, Sleep, (ZZZQUIL PO) Take by mouth.    . rosuvastatin (CRESTOR) 10 MG tablet Take 1 tablet (10 mg total) by mouth daily. 30 tablet 4   No current facility-administered medications for this visit.    Physical Exam BP (!) 160/80   Pulse 90   Temp 99.5 F (37.5 C) (Skin)   Resp 20   Ht 5\' 9"  (1.753 m)   Wt 215 lb (97.5 kg)   SpO2 98% Comment: RA  BMI 31.82 kg/m  54 year old man in no acute distress Well-developed and well-nourished Alert and oriented x3 with no focal deficits Cardiac regular rate and rhythm with normal S1 and S2 with no rubs murmurs or gallops Lungs clear with equal breath sounds bilaterally, no rales or wheezing Abdomen soft nontender No clubbing cyanosis or edema  Diagnostic Tests: CT CHEST WITHOUT CONTRAST  TECHNIQUE: Multidetector CT imaging of  the chest was performed following the standard protocol without IV contrast.  COMPARISON:  03/23/2020 and 02/21/2015.  FINDINGS: Cardiovascular: Aortic and branch vessel atherosclerosis. Tortuous thoracic aorta. Normal heart size, without pericardial effusion.  Mediastinum/Nodes: No mediastinal or definite hilar adenopathy, given limitations of unenhanced CT.  Lungs/Pleura: No pleural fluid. Moderate paraseptal and mild centrilobular emphysema.  The right upper lobe ground-glass opacities have resolved.  Again identified is a right upper lobe dominant bulla/bleb. Measures on the order of 5.5 x 2.2 cm on 82/8. Decreased from 6.6 x 3.9 cm on the prior exam (when remeasured).  Significant decrease in dependent layering fluid, including on 84/8. Improvement in wall thickening.  Upper Abdomen: Low-density left hepatic lobe subcentimeter lesions are likely cysts. Normal imaged portions of the spleen, stomach, pancreas, adrenal glands, kidneys, gallbladder.  Musculoskeletal: Superior endplate compression  deformity at T6 is similar, mild.  IMPRESSION: 1. Resolution of previously described right upper lobe ground-glass opacity, likely infection. 2. Improved appearance of the dominant right-sided bulla/bleb, with decreased layering fluid and wall thickening. Likely resolving superinfection. 3. Aortic atherosclerosis (ICD10-I70.0) and emphysema (ICD10-J43.9).   Electronically Signed   By: Abigail Miyamoto M.D.   On: 05/09/2020 15:36 I personally reviewed the CT images and concur with the findings noted above  Impression: Aaron Griffin is a 54 year old man with a history of remote tobacco use, sleep apnea, aortic atherosclerosis, and a giant bulla of the right lung.  He recently had an infection of the bulla and pneumonia of the surrounding lung in the right upper lobe.  That was treated with antibiotics.  He had a good clinical response.  His CT today shows an excellent radiographic response as well.  I reviewed the images with Mr. and Mrs. Ozdemir.  We discussed options of surgical resection versus conservative management.  In my opinion, he is highly likely to have recurrent infections in that bulla.  It may be that those infections can be treated with oral antibiotics as this most recent one was, but there is always the risk that he could become seriously ill.  They do understand there is no way to know for sure.  I recommended that we go ahead and resect that bulla along with the apical blebs in the right lung.   I discussed the proposed surgical procedure with them.  We would plan to do a robotic approach for resection of the bulla and blebs.  They do understand it might be necessary to convert to a thoracotomy if there is extensive scarring in the chest.  I informed them of the general nature of the procedure including the incisions to be used, the need for general anesthesia, the use of drains to postoperatively, the expected hospital stay, and the overall recovery.  I informed him of the  indications, risks, benefits, and alternatives.  They understand the risks include, but not limited to death, MI, DVT, PE, bleeding, possible need for transfusion, infection, prolonged air leak, cardiac arrhythmias, as well as possibility of other unforeseeable complications.  He wishes to proceed.  Plan: Robotic right VATS for resection of bulla and blebs, possible thoracotomy on Thursday, 05/18/2020  Melrose Nakayama, MD [facility] [phone]

## 2020-05-10 ENCOUNTER — Other Ambulatory Visit: Payer: Self-pay | Admitting: *Deleted

## 2020-05-10 ENCOUNTER — Encounter: Payer: Self-pay | Admitting: *Deleted

## 2020-05-10 DIAGNOSIS — J439 Emphysema, unspecified: Secondary | ICD-10-CM

## 2020-05-15 NOTE — Progress Notes (Signed)
Your procedure is scheduled on Thursday, May 18, 2020.  Report to [facility] 2 Main Entrance "A" at 6:00 A.M., and check in at the Admitting office.  Call this number if you have problems the morning of surgery:  [phone]  Call [phone] if you have any questions prior to your surgery date Monday-Friday 8am-4pm    Remember:  Do not eat or drink after midnight the night before your surgery     Take these medicines the morning of surgery with A SIP OF WATER:  rosuvastatin (CRESTOR)   As of today, STOP taking any Aspirin (unless otherwise instructed by your surgeon) Aleve, Naproxen, Ibuprofen, Motrin, Advil, Goody's, BC's, all herbal medications, fish oil, and all vitamins.                      Do not wear jewelry.            Do not wear lotions, powders, colognes, or deodorant.            Do not shave 48 hours prior to surgery.  Men may shave face and neck.            Do not bring valuables to the hospital.            [facility] is not responsible for any belongings or valuables.  Do NOT Smoke (Tobacco/Vaping) or drink Alcohol 24 hours prior to your procedure If you use a CPAP at night, you may bring all equipment for your overnight stay.   Contacts, glasses, dentures or bridgework may not be worn into surgery.      For patients admitted to the hospital, discharge time will be determined by your treatment team.   Patients discharged the day of surgery will not be allowed to drive home, and someone needs to stay with them for 24 hours.    Special instructions:   [place]- Preparing For Surgery  Before surgery, you can play an important role. Because skin is not sterile, your skin needs to be as free of germs as possible. You can reduce the number of germs on your skin by washing with CHG (chlorahexidine gluconate) Soap before surgery.  CHG is an antiseptic cleaner which kills germs and bonds with the skin to continue killing germs even after washing.    Oral Hygiene  is also important to reduce your risk of infection.  Remember - BRUSH YOUR TEETH THE MORNING OF SURGERY WITH YOUR REGULAR TOOTHPASTE  Please do not use if you have an allergy to CHG or antibacterial soaps. If your skin becomes reddened/irritated stop using the CHG.  Do not shave (including legs and underarms) for at least 48 hours prior to first CHG shower. It is OK to shave your face.  Please follow these instructions carefully.   1. Shower the NIGHT BEFORE SURGERY and the MORNING OF SURGERY with CHG Soap.   2. If you chose to wash your hair, wash your hair first as usual with your normal shampoo.  3. After you shampoo, rinse your hair and body thoroughly to remove the shampoo.  4. Use CHG as you would any other liquid soap. You can apply CHG directly to the skin and wash gently with a scrungie or a clean washcloth.   5. Apply the CHG Soap to your body ONLY FROM THE NECK DOWN.  Do not use on open wounds or open sores. Avoid contact with your eyes, ears, mouth and genitals (private parts). Wash Face and genitals (  private parts)  with your normal soap.   6. Wash thoroughly, paying special attention to the area where your surgery will be performed.  7. Thoroughly rinse your body with warm water from the neck down.  8. DO NOT shower/wash with your normal soap after using and rinsing off the CHG Soap.  9. Pat yourself dry with a CLEAN TOWEL.  10. Wear CLEAN PAJAMAS to bed the night before surgery  11. Place CLEAN SHEETS on your bed the night of your first shower and DO NOT SLEEP WITH PETS.   Day of Surgery: Wear Clean/Comfortable clothing the morning of surgery Do not apply any deodorants/lotions.   Remember to brush your teeth WITH YOUR REGULAR TOOTHPASTE.   Please read over the following fact sheets that you were given.

## 2020-05-16 ENCOUNTER — Other Ambulatory Visit: Payer: Self-pay | Admitting: *Deleted

## 2020-05-16 ENCOUNTER — Other Ambulatory Visit: Payer: Self-pay

## 2020-05-16 ENCOUNTER — Encounter (HOSPITAL_COMMUNITY)
Admission: RE | Admit: 2020-05-16 | Discharge: 2020-05-16 | Disposition: A | Discharge status: Home / Self Care | Payer: 59 | Source: Ambulatory Visit | Attending: Thoracic Surgery (Cardiothoracic Vascular Surgery) | Admitting: Thoracic Surgery (Cardiothoracic Vascular Surgery)

## 2020-05-16 ENCOUNTER — Ambulatory Visit (HOSPITAL_COMMUNITY)
Admission: RE | Admit: 2020-05-16 | Discharge: 2020-05-16 | Disposition: A | Discharge status: Home / Self Care | Payer: 59 | Source: Ambulatory Visit | Attending: Thoracic Surgery (Cardiothoracic Vascular Surgery) | Admitting: Thoracic Surgery (Cardiothoracic Vascular Surgery)

## 2020-05-16 ENCOUNTER — Encounter (HOSPITAL_COMMUNITY): Payer: Self-pay

## 2020-05-16 ENCOUNTER — Other Ambulatory Visit (HOSPITAL_COMMUNITY)
Admission: RE | Admit: 2020-05-16 | Discharge: 2020-05-16 | Disposition: A | Discharge status: Home / Self Care | Payer: 59 | Source: Ambulatory Visit | Attending: Thoracic Surgery (Cardiothoracic Vascular Surgery) | Admitting: Thoracic Surgery (Cardiothoracic Vascular Surgery)

## 2020-05-16 DIAGNOSIS — J439 Emphysema, unspecified: Secondary | ICD-10-CM

## 2020-05-16 DIAGNOSIS — Z01812 Encounter for preprocedural laboratory examination: Secondary | ICD-10-CM | POA: Insufficient documentation

## 2020-05-16 DIAGNOSIS — Z20822 Contact with and (suspected) exposure to covid-19: Secondary | ICD-10-CM | POA: Insufficient documentation

## 2020-05-16 LAB — APTT: aPTT: >200 seconds (ref 24–36)

## 2020-05-16 LAB — BLOOD GAS, ARTERIAL
Acid-Base Excess: 0.5 mmol/L (ref 0.0–2.0)
Bicarbonate: 24.2 mmol/L (ref 20.0–28.0)
Drawn by: 58793
FIO2: 21
O2 Saturation: 98.2 %
Patient temperature: 37
pCO2 arterial: 36.6 mmHg (ref 32.0–48.0)
pH, Arterial: 7.436 (ref 7.350–7.450)
pO2, Arterial: 105 mmHg (ref 83.0–108.0)

## 2020-05-16 LAB — CBC
HCT: 42.2 % (ref 39.0–52.0)
Hemoglobin: 13.8 g/dL (ref 13.0–17.0)
MCH: 30.4 pg (ref 26.0–34.0)
MCHC: 32.7 g/dL (ref 30.0–36.0)
MCV: 93 fL (ref 80.0–100.0)
Platelets: 281 10*3/uL (ref 150–400)
RBC: 4.54 MIL/uL (ref 4.22–5.81)
RDW: 12.4 % (ref 11.5–15.5)
WBC: 8.8 10*3/uL (ref 4.0–10.5)
nRBC: 0 % (ref 0.0–0.2)

## 2020-05-16 LAB — SARS CORONAVIRUS 2 (TAT 6-24 HRS): SARS Coronavirus 2: NEGATIVE

## 2020-05-16 LAB — COMPREHENSIVE METABOLIC PANEL
ALT: 33 U/L (ref 0–44)
AST: 24 U/L (ref 15–41)
Albumin: 4.1 g/dL (ref 3.5–5.0)
Alkaline Phosphatase: 53 U/L (ref 38–126)
Anion gap: 12 (ref 5–15)
BUN: 12 mg/dL (ref 6–20)
CO2: 20 mmol/L — ABNORMAL LOW (ref 22–32)
Calcium: 9.7 mg/dL (ref 8.9–10.3)
Chloride: 106 mmol/L (ref 98–111)
Creatinine, Ser: 1.15 mg/dL (ref 0.61–1.24)
GFR calc non Af Amer: >60 mL/min (ref >60)
Glucose, Bld: 132 mg/dL — ABNORMAL HIGH (ref 70–99)
Potassium: 4 mmol/L (ref 3.5–5.1)
Sodium: 138 mmol/L (ref 135–145)
Total Bilirubin: 0.4 mg/dL (ref 0.3–1.2)
Total Protein: 7.3 g/dL (ref 6.5–8.1)

## 2020-05-16 LAB — URINALYSIS, ROUTINE W REFLEX MICROSCOPIC
Bilirubin Urine: NEGATIVE
Glucose, UA: NEGATIVE mg/dL
Hgb urine dipstick: NEGATIVE
Ketones, ur: NEGATIVE mg/dL
Leukocytes,Ua: NEGATIVE
Nitrite: NEGATIVE
Protein, ur: NEGATIVE mg/dL
Specific Gravity, Urine: 1.013 (ref 1.005–1.030)
pH: 5 (ref 5.0–8.0)

## 2020-05-16 LAB — PROTIME-INR
INR: 1 (ref 0.8–1.2)
Prothrombin Time: 13.2 seconds (ref 11.4–15.2)

## 2020-05-16 LAB — TYPE AND SCREEN
ABO/RH(D): A POS
Antibody Screen: NEGATIVE

## 2020-05-16 LAB — SURGICAL PCR SCREEN
MRSA, PCR: NEGATIVE
Staphylococcus aureus: NEGATIVE

## 2020-05-16 NOTE — Progress Notes (Signed)
Anesthesia Chart Review:  Case: [id number] Date/Time: 05/18/20 0745   Procedures:      XI ROBOTIC ASSISTED THORASCOPY (Right )     RESECTION OF BULLA/BLEBS (Right )     possible THORACOTOMY (Right )   Anesthesia type: General   Pre-op diagnosis: GIANT BULLA RIGHT LUNG   Location: [facility] OR ROOM 10 / [place] [facility]   Surgeons: Melrose Nakayama, MD      DISCUSSION: Patient is a 54 year old male scheduled for the above procedure. In early August 2021, patient developed fatigue, cough, and right anterior chest pleuritic chest pain with imaging showing fluid level within known large right lung bulla with adjacent RUL infiltrate. He was prescribed a total of 20 days of Augmentin, but only completed about 17 days due to trouble swallowing the pills. Pulmonology referred to Dr. Roxan Hockey for consideration of bled resection. Given infection in August, Dr. Roxan Hockey recommended waiting for 6 weeks.  Since then, patient reportedly back at work and not having symptoms of shortness of breath, cough, fever, chills, sweats, or pleuritic chest pain.  Other history includes former smoker (quit 03/06/15), OSA (CPAP), emphysema. BMI is consistent with obesity.  Dr. Roxan Hockey classified patient's Zubrod Score as 0 (normal activity, no symptoms).  05/16/2020 presurgical COVID-19 test negative.  He is for repeat PTT and anesthesia team evaluation on the day of surgery.   VS: BP 129/77   Pulse 72   Temp 37 C (Oral)   Resp 19   Ht 5\' 9"  (1.753 m)   Wt 98.3 kg   SpO2 100%   BMI 32.02 kg/m    PROVIDERS: Kathyrn Drown, MD is PCP Christinia Gully, MD is pulmonologist (previously saw Sinda Du, MD, now retired)   LABS: Labs reviewed: Repeat PTT on the day of surgery since > 200, felt likely erroneous result. (all labs ordered are listed, but only abnormal results are displayed)  Labs Reviewed  APTT - Abnormal; Notable for the following components:      Result Value   aPTT >200 (*)    All other  components within normal limits  BLOOD GAS, ARTERIAL - Abnormal; Notable for the following components:   Allens test (pass/fail) BRACHIAL ARTERY (*)    All other components within normal limits  COMPREHENSIVE METABOLIC PANEL - Abnormal; Notable for the following components:   CO2 20 (*)    Glucose, Bld 132 (*)    All other components within normal limits  SURGICAL PCR SCREEN  CBC  PROTIME-INR  URINALYSIS, ROUTINE W REFLEX MICROSCOPIC  TYPE AND SCREEN     IMAGES: CXR 05/16/20: FINDINGS: The heart size and mediastinal contours are within normal limits. Bulla at the medial aspect of the right lung appears decreased in size compared to prior radiograph, and similar to the more recent CT. No air-fluid level seen. No focal airspace consolidation, pleural effusion, or pneumothorax. The visualized skeletal structures are unremarkable. IMPRESSION: 1. No active cardiopulmonary disease. 2. Bulla at the medial aspect of the right lung appears decreased in size compared to prior radiograph, and similar to the more recent CT. No air-fluid level seen.  CT Chest 05/09/20: IMPRESSION: 1. Resolution of previously described right upper lobe ground-glass opacity, likely infection. 2. Improved appearance of the dominant right-sided bulla/bleb, with decreased layering fluid and wall thickening. Likely resolving superinfection. 3. Aortic atherosclerosis (ICD10-I70.0) and emphysema (ICD10-J43.9).   EKG: 05/16/20: Normal sinus rhythm Incomplete right bundle branch block Borderline ECG Confirmed by Kate Sable [phone]) on 05/16/2020 10:24:21 AM   CV:  Denied prior stress test, echocardiogram, cardiac cath.  Past Medical History:  Diagnosis Date  . Bulla of lung ([place])   . Emphysema lung ([place])   . Sleep apnea    cpap    Past Surgical History:  Procedure Laterality Date  . WISDOM TOOTH EXTRACTION      MEDICATIONS: . diphenhydrAMINE HCl (ZZZQUIL) 50 MG/30ML LIQD  . rosuvastatin  (CRESTOR) 10 MG tablet   No current facility-administered medications for this encounter.    Myra Gianotti, PA-C Surgical Short Stay/Anesthesiology [facility] Phone [phone] [facility] Phone [phone] 05/16/2020 5:04 PM

## 2020-05-16 NOTE — Anesthesia Preprocedure Evaluation (Addendum)
Anesthesia Evaluation  Patient identified by MRN, date of birth, ID band Patient awake    Reviewed: Allergy & Precautions, NPO status , Patient's Chart, lab work & pertinent test results  History of Anesthesia Complications Negative for: history of anesthetic complications  Airway Mallampati: I  TM Distance: >3 FB Neck ROM: Full    Dental  (+) Upper Dentures, Loose, Missing, Dental Advisory Given   Pulmonary sleep apnea and Continuous Positive Airway Pressure Ventilation , pneumonia, resolved, COPD, former smoker,  05/16/2020 SARS coronavirus NEG   breath sounds clear to auscultation       Cardiovascular negative cardio ROS   Rhythm:Regular Rate:Normal     Neuro/Psych negative neurological ROS     GI/Hepatic negative GI ROS, Neg liver ROS,   Endo/Other  obese  Renal/GU negative Renal ROS     Musculoskeletal   Abdominal (+) + obese,   Peds  Hematology negative hematology ROS (+)   Anesthesia Other Findings   Reproductive/Obstetrics                            Anesthesia Physical Anesthesia Plan  ASA: III  Anesthesia Plan: General   Post-op Pain Management:    Induction: Intravenous  PONV Risk Score and Plan: 2 and Ondansetron, Dexamethasone and Treatment may vary due to age or medical condition  Airway Management Planned: Oral ETT and Double Lumen EBT  Additional Equipment: Arterial line  Intra-op Plan:   Post-operative Plan: Extubation in OR  Informed Consent: I have reviewed the patients History and Physical, chart, labs and discussed the procedure including the risks, benefits and alternatives for the proposed anesthesia with the patient or authorized representative who has indicated his/her understanding and acceptance.     Dental advisory given  Plan Discussed with: CRNA and Surgeon  Anesthesia Plan Comments: (PAT note written 05/16/2020 by Myra Gianotti, PA-C. )        Anesthesia Quick Evaluation

## 2020-05-16 NOTE — Progress Notes (Signed)
Notified Levonne Spiller, RN via IB about pt's abnormal PTT lab value.

## 2020-05-16 NOTE — Progress Notes (Signed)
PCP - Dr. Sallee Lange Cardiologist - Denies   PPM/ICD - Denies  Chest x-ray - 05/16/20 EKG - 05/16/20 Stress Test - Denies  ECHO - Denies Cardiac Cath - Denies  Sleep Study - Yes CPAP - Yes  Patient denies being diabetic.  Blood Thinner Instructions: N/A Aspirin Instructions: N/A  ERAS Protcol - No  COVID TEST- 05/16/20   Anesthesia review: Yes, abnormal EKG  Patient denies shortness of breath, fever, cough and chest pain at PAT appointment   All instructions explained to the patient, with a verbal understanding of the material. Patient agrees to go over the instructions while at home for a better understanding. Patient also instructed to self quarantine after being tested for COVID-19. The opportunity to ask questions was provided.

## 2020-05-18 ENCOUNTER — Inpatient Hospital Stay (HOSPITAL_COMMUNITY): Payer: 59

## 2020-05-18 ENCOUNTER — Inpatient Hospital Stay (HOSPITAL_COMMUNITY): Payer: 59 | Admitting: Vascular Surgery

## 2020-05-18 ENCOUNTER — Inpatient Hospital Stay (HOSPITAL_COMMUNITY)
Admission: RE | Admit: 2020-05-18 | Discharge: 2020-05-21 | DRG: 165 | Disposition: A | Discharge status: Home / Self Care | Payer: 59 | Attending: Thoracic Surgery (Cardiothoracic Vascular Surgery) | Admitting: Thoracic Surgery (Cardiothoracic Vascular Surgery)

## 2020-05-18 ENCOUNTER — Other Ambulatory Visit: Payer: Self-pay

## 2020-05-18 ENCOUNTER — Encounter (HOSPITAL_COMMUNITY): Payer: Self-pay | Admitting: Thoracic Surgery (Cardiothoracic Vascular Surgery)

## 2020-05-18 ENCOUNTER — Inpatient Hospital Stay (HOSPITAL_COMMUNITY): Payer: 59 | Admitting: Certified Registered Nurse Anesthetist

## 2020-05-18 ENCOUNTER — Encounter (HOSPITAL_COMMUNITY)
Admission: RE | Disposition: A | Discharge status: Home / Self Care | Payer: Self-pay | Source: Home / Self Care | Attending: Thoracic Surgery (Cardiothoracic Vascular Surgery)

## 2020-05-18 DIAGNOSIS — Z20822 Contact with and (suspected) exposure to covid-19: Secondary | ICD-10-CM | POA: Diagnosis present

## 2020-05-18 DIAGNOSIS — I700 Atherosclerosis of aorta: Secondary | ICD-10-CM | POA: Diagnosis present

## 2020-05-18 DIAGNOSIS — Z6831 Body mass index (BMI) 31.0-31.9, adult: Secondary | ICD-10-CM | POA: Diagnosis not present

## 2020-05-18 DIAGNOSIS — Z01812 Encounter for preprocedural laboratory examination: Secondary | ICD-10-CM | POA: Diagnosis not present

## 2020-05-18 DIAGNOSIS — Z9889 Other specified postprocedural states: Secondary | ICD-10-CM

## 2020-05-18 DIAGNOSIS — Z79899 Other long term (current) drug therapy: Secondary | ICD-10-CM | POA: Diagnosis not present

## 2020-05-18 DIAGNOSIS — E669 Obesity, unspecified: Secondary | ICD-10-CM | POA: Diagnosis present

## 2020-05-18 DIAGNOSIS — J439 Emphysema, unspecified: Principal | ICD-10-CM | POA: Diagnosis present

## 2020-05-18 DIAGNOSIS — G4733 Obstructive sleep apnea (adult) (pediatric): Secondary | ICD-10-CM | POA: Diagnosis present

## 2020-05-18 DIAGNOSIS — Z87891 Personal history of nicotine dependence: Secondary | ICD-10-CM | POA: Diagnosis not present

## 2020-05-18 DIAGNOSIS — Z8701 Personal history of pneumonia (recurrent): Secondary | ICD-10-CM

## 2020-05-18 DIAGNOSIS — Z09 Encounter for follow-up examination after completed treatment for conditions other than malignant neoplasm: Secondary | ICD-10-CM

## 2020-05-18 HISTORY — PX: EXCISION OF BULLA: SHX5095

## 2020-05-18 HISTORY — PX: INTERCOSTAL NERVE BLOCK: SHX5021

## 2020-05-18 LAB — APTT: aPTT: 31 seconds (ref 24–36)

## 2020-05-18 LAB — ABO/RH: ABO/RH(D): A POS

## 2020-05-18 SURGERY — XI ROBOTIC ASSISTED THORACOSCOPY - DNU
Anesthesia: General | Site: Chest | Laterality: Right

## 2020-05-18 MED ORDER — DEXAMETHASONE SODIUM PHOSPHATE 10 MG/ML IJ SOLN
INTRAMUSCULAR | Status: AC
  Filled 2020-05-18: qty 1

## 2020-05-18 MED ORDER — ACETAMINOPHEN 160 MG/5ML PO SOLN
1000.0000 mg | Freq: Four times a day (QID) | ORAL | Status: DC
  Administered 2020-05-19 – 2020-05-21 (×8): 1000 mg via ORAL
  Filled 2020-05-18 (×8): qty 40.6

## 2020-05-18 MED ORDER — CHLORHEXIDINE GLUCONATE CLOTH 2 % EX PADS: 6.0000 | MEDICATED_PAD | Freq: Every day | CUTANEOUS | Status: DC

## 2020-05-18 MED ORDER — CEFAZOLIN SODIUM-DEXTROSE 2-4 GM/100ML-% IV SOLN
2.0000 g | INTRAVENOUS | Status: AC
  Administered 2020-05-18: 2 g via INTRAVENOUS
  Filled 2020-05-18: qty 100

## 2020-05-18 MED ORDER — BUPIVACAINE HCL (PF) 0.5 % IJ SOLN
INTRAMUSCULAR | Status: AC
  Filled 2020-05-18: qty 30

## 2020-05-18 MED ORDER — MIDAZOLAM HCL 2 MG/2ML IJ SOLN: 0.5000 mg | Freq: Once | INTRAMUSCULAR | Status: DC | PRN

## 2020-05-18 MED ORDER — ROCURONIUM BROMIDE 10 MG/ML (PF) SYRINGE
PREFILLED_SYRINGE | INTRAVENOUS | Status: AC
  Filled 2020-05-18: qty 10

## 2020-05-18 MED ORDER — FENTANYL CITRATE (PF) 250 MCG/5ML IJ SOLN
INTRAMUSCULAR | Status: AC
  Filled 2020-05-18: qty 5

## 2020-05-18 MED ORDER — PHENYLEPHRINE 40 MCG/ML (10ML) SYRINGE FOR IV PUSH (FOR BLOOD PRESSURE SUPPORT)
PREFILLED_SYRINGE | INTRAVENOUS | Status: AC
  Filled 2020-05-18: qty 10

## 2020-05-18 MED ORDER — FENTANYL CITRATE (PF) 250 MCG/5ML IJ SOLN
INTRAMUSCULAR | Status: DC | PRN
Start: 2020-05-18 — End: 2020-05-18
  Administered 2020-05-18: 50 ug via INTRAVENOUS
  Administered 2020-05-18: 250 ug via INTRAVENOUS

## 2020-05-18 MED ORDER — LIDOCAINE 2% (20 MG/ML) 5 ML SYRINGE
INTRAMUSCULAR | Status: DC | PRN
  Administered 2020-05-18: 20 mg via INTRAVENOUS

## 2020-05-18 MED ORDER — ROCURONIUM BROMIDE 10 MG/ML (PF) SYRINGE
PREFILLED_SYRINGE | INTRAVENOUS | Status: DC | PRN
  Administered 2020-05-18: 70 mg via INTRAVENOUS
  Administered 2020-05-18: 30 mg via INTRAVENOUS
  Administered 2020-05-18: 10 mg via INTRAVENOUS
  Administered 2020-05-18: 20 mg via INTRAVENOUS

## 2020-05-18 MED ORDER — SUGAMMADEX SODIUM 200 MG/2ML IV SOLN
INTRAVENOUS | Status: DC | PRN
  Administered 2020-05-18: 200 mg via INTRAVENOUS

## 2020-05-18 MED ORDER — OXYCODONE HCL 5 MG/5ML PO SOLN
ORAL | Status: AC
Start: 2020-05-18 — End: 2020-05-19
  Filled 2020-05-18: qty 5

## 2020-05-18 MED ORDER — PROPOFOL 10 MG/ML IV BOLUS
INTRAVENOUS | Status: AC
  Filled 2020-05-18: qty 20

## 2020-05-18 MED ORDER — PNEUMOCOCCAL VAC POLYVALENT 25 MCG/0.5ML IJ INJ: 0.5000 mL | INJECTION | INTRAMUSCULAR | Status: DC

## 2020-05-18 MED ORDER — ENOXAPARIN SODIUM 40 MG/0.4ML ~~LOC~~ SOLN
40.0000 mg | Freq: Every day | SUBCUTANEOUS | Status: DC
  Administered 2020-05-19: 40 mg via SUBCUTANEOUS
  Filled 2020-05-18 (×3): qty 0.4

## 2020-05-18 MED ORDER — KETOROLAC TROMETHAMINE 30 MG/ML IJ SOLN
INTRAMUSCULAR | Status: DC | PRN
  Administered 2020-05-18: 30 mg via INTRAVENOUS

## 2020-05-18 MED ORDER — MIDAZOLAM HCL 2 MG/2ML IJ SOLN
INTRAMUSCULAR | Status: AC
  Filled 2020-05-18: qty 2

## 2020-05-18 MED ORDER — OXYCODONE HCL 5 MG/5ML PO SOLN
5.0000 mg | Freq: Once | ORAL | Status: AC | PRN
  Administered 2020-05-18: 5 mg via ORAL

## 2020-05-18 MED ORDER — LACTATED RINGERS IV SOLN: INTRAVENOUS | Status: DC | PRN

## 2020-05-18 MED ORDER — DEXAMETHASONE SODIUM PHOSPHATE 10 MG/ML IJ SOLN
INTRAMUSCULAR | Status: DC | PRN
  Administered 2020-05-18: 8 mg via INTRAVENOUS

## 2020-05-18 MED ORDER — PHENYLEPHRINE HCL-NACL 10-0.9 MG/250ML-% IV SOLN
INTRAVENOUS | Status: DC | PRN
  Administered 2020-05-18: 20 ug/min via INTRAVENOUS

## 2020-05-18 MED ORDER — PROPOFOL 10 MG/ML IV BOLUS
INTRAVENOUS | Status: DC | PRN
  Administered 2020-05-18: 250 mg via INTRAVENOUS

## 2020-05-18 MED ORDER — KETOROLAC TROMETHAMINE 30 MG/ML IJ SOLN
INTRAMUSCULAR | Status: AC
  Filled 2020-05-18: qty 1

## 2020-05-18 MED ORDER — CEFAZOLIN SODIUM-DEXTROSE 2-4 GM/100ML-% IV SOLN
2.0000 g | Freq: Three times a day (TID) | INTRAVENOUS | Status: AC
  Administered 2020-05-18 – 2020-05-19 (×2): 2 g via INTRAVENOUS
  Filled 2020-05-18 (×2): qty 100

## 2020-05-18 MED ORDER — ONDANSETRON HCL 4 MG/2ML IJ SOLN
INTRAMUSCULAR | Status: AC
  Filled 2020-05-18: qty 2

## 2020-05-18 MED ORDER — KETOROLAC TROMETHAMINE 30 MG/ML IJ SOLN
30.0000 mg | Freq: Four times a day (QID) | INTRAMUSCULAR | Status: AC
  Administered 2020-05-18 – 2020-05-20 (×8): 30 mg via INTRAVENOUS
  Filled 2020-05-18 (×7): qty 1

## 2020-05-18 MED ORDER — FENTANYL CITRATE (PF) 100 MCG/2ML IJ SOLN
25.0000 ug | INTRAMUSCULAR | Status: DC | PRN
  Administered 2020-05-19: 50 ug via INTRAVENOUS
  Filled 2020-05-18: qty 2

## 2020-05-18 MED ORDER — KETOROLAC TROMETHAMINE 0.5 % OP SOLN
OPHTHALMIC | Status: AC
  Administered 2020-05-18: 1 [drp] via OPHTHALMIC
  Filled 2020-05-18: qty 5

## 2020-05-18 MED ORDER — ORAL CARE MOUTH RINSE: 15.0000 mL | Freq: Once | OROMUCOSAL | Status: AC

## 2020-05-18 MED ORDER — LIDOCAINE 2% (20 MG/ML) 5 ML SYRINGE
INTRAMUSCULAR | Status: AC
  Filled 2020-05-18: qty 5

## 2020-05-18 MED ORDER — 0.9 % SODIUM CHLORIDE (POUR BTL) OPTIME
TOPICAL | Status: DC | PRN
  Administered 2020-05-18: 3000 mL

## 2020-05-18 MED ORDER — SENNOSIDES-DOCUSATE SODIUM 8.6-50 MG PO TABS
1.0000 | ORAL_TABLET | Freq: Every day | ORAL | Status: DC
  Administered 2020-05-18 – 2020-05-19 (×2): 1 via ORAL
  Filled 2020-05-18 (×2): qty 1

## 2020-05-18 MED ORDER — PHENYLEPHRINE 40 MCG/ML (10ML) SYRINGE FOR IV PUSH (FOR BLOOD PRESSURE SUPPORT)
PREFILLED_SYRINGE | INTRAVENOUS | Status: DC | PRN
  Administered 2020-05-18 (×2): 80 ug via INTRAVENOUS
  Administered 2020-05-18: 40 ug via INTRAVENOUS
  Administered 2020-05-18: 80 ug via INTRAVENOUS

## 2020-05-18 MED ORDER — OXYCODONE HCL 5 MG PO TABS: 5.0000 mg | ORAL_TABLET | Freq: Once | ORAL | Status: AC | PRN

## 2020-05-18 MED ORDER — BISACODYL 5 MG PO TBEC
10.0000 mg | DELAYED_RELEASE_TABLET | Freq: Every day | ORAL | Status: DC
  Administered 2020-05-19 – 2020-05-20 (×2): 10 mg via ORAL
  Filled 2020-05-18 (×3): qty 2

## 2020-05-18 MED ORDER — ONDANSETRON HCL 4 MG/2ML IJ SOLN
INTRAMUSCULAR | Status: DC | PRN
  Administered 2020-05-18: 4 mg via INTRAVENOUS

## 2020-05-18 MED ORDER — MEPERIDINE HCL 25 MG/ML IJ SOLN: 6.2500 mg | INTRAMUSCULAR | Status: DC | PRN

## 2020-05-18 MED ORDER — SODIUM CHLORIDE 0.9 % IV SOLN: INTRAVENOUS | Status: DC | PRN

## 2020-05-18 MED ORDER — HYDROMORPHONE HCL 1 MG/ML IJ SOLN: 0.2500 mg | INTRAMUSCULAR | Status: DC | PRN

## 2020-05-18 MED ORDER — BUPIVACAINE LIPOSOME 1.3 % IJ SUSP
20.0000 mL | Freq: Once | INTRAMUSCULAR | Status: DC
  Filled 2020-05-18: qty 20

## 2020-05-18 MED ORDER — CHLORHEXIDINE GLUCONATE 0.12 % MT SOLN
15.0000 mL | Freq: Once | OROMUCOSAL | Status: AC
  Administered 2020-05-18: 15 mL via OROMUCOSAL
  Filled 2020-05-18: qty 15

## 2020-05-18 MED ORDER — ALBUMIN HUMAN 5 % IV SOLN: INTRAVENOUS | Status: DC | PRN

## 2020-05-18 MED ORDER — ROSUVASTATIN CALCIUM 5 MG PO TABS
10.0000 mg | ORAL_TABLET | Freq: Every day | ORAL | Status: DC
  Administered 2020-05-19 – 2020-05-20 (×2): 10 mg via ORAL
  Filled 2020-05-18 (×3): qty 2

## 2020-05-18 MED ORDER — DEXTROSE-NACL 5-0.9 % IV SOLN: INTRAVENOUS | Status: DC

## 2020-05-18 MED ORDER — TRAMADOL HCL 50 MG PO TABS
50.0000 mg | ORAL_TABLET | Freq: Four times a day (QID) | ORAL | Status: DC | PRN
  Administered 2020-05-18 – 2020-05-20 (×2): 100 mg via ORAL
  Filled 2020-05-18 (×2): qty 2

## 2020-05-18 MED ORDER — PROMETHAZINE HCL 25 MG/ML IJ SOLN: 6.2500 mg | INTRAMUSCULAR | Status: DC | PRN

## 2020-05-18 MED ORDER — ONDANSETRON HCL 4 MG/2ML IJ SOLN: 4.0000 mg | Freq: Four times a day (QID) | INTRAMUSCULAR | Status: DC | PRN

## 2020-05-18 MED ORDER — PANTOPRAZOLE SODIUM 40 MG PO TBEC
40.0000 mg | DELAYED_RELEASE_TABLET | Freq: Every day | ORAL | Status: DC
  Administered 2020-05-19 – 2020-05-20 (×2): 40 mg via ORAL
  Filled 2020-05-18 (×3): qty 1

## 2020-05-18 MED ORDER — INFLUENZA VAC SPLIT QUAD 0.5 ML IM SUSY
0.5000 mL | PREFILLED_SYRINGE | INTRAMUSCULAR | Status: DC
  Filled 2020-05-18: qty 0.5

## 2020-05-18 MED ORDER — MIDAZOLAM HCL 2 MG/2ML IJ SOLN
INTRAMUSCULAR | Status: DC | PRN
  Administered 2020-05-18: 2 mg via INTRAVENOUS

## 2020-05-18 MED ORDER — SODIUM CHLORIDE FLUSH 0.9 % IV SOLN
INTRAVENOUS | Status: DC | PRN
  Administered 2020-05-18: 93 mL

## 2020-05-18 MED ORDER — ACETAMINOPHEN 500 MG PO TABS
1000.0000 mg | ORAL_TABLET | Freq: Four times a day (QID) | ORAL | Status: DC
  Administered 2020-05-18 – 2020-05-19 (×3): 1000 mg via ORAL
  Filled 2020-05-18 (×3): qty 2

## 2020-05-18 MED ORDER — LACTATED RINGERS IV SOLN: INTRAVENOUS | Status: DC

## 2020-05-18 MED ORDER — KETOROLAC TROMETHAMINE 0.5 % OP SOLN
1.0000 [drp] | Freq: Four times a day (QID) | OPHTHALMIC | Status: AC
  Administered 2020-05-18 – 2020-05-19 (×5): 1 [drp] via OPHTHALMIC
  Filled 2020-05-18: qty 5

## 2020-05-18 SURGICAL SUPPLY — 129 items
ADH SKN CLS APL DERMABOND .7 (GAUZE/BANDAGES/DRESSINGS) ×2
ADH SKN CLS LQ APL DERMABOND (GAUZE/BANDAGES/DRESSINGS) ×2
APPLIER CLIP ROT 10 11.4 M/L (STAPLE)
APR CLP MED LRG 11.4X10 (STAPLE)
BAG SPEC RTRVL C125 8X14 (MISCELLANEOUS) ×2
BIT DRILL 7/64X5 DISP (BIT) IMPLANT
BLADE CLIPPER SURG (BLADE) ×6 IMPLANT
CANISTER SUCT 3000ML PPV (MISCELLANEOUS) ×9 IMPLANT
CANNULA REDUC XI 12-8 STAPL (CANNULA) ×6
CANNULA REDUCER 12-8 DVNC XI (CANNULA) ×4 IMPLANT
CATH THORACIC 28FR (CATHETERS) IMPLANT
CATH THORACIC 28FR RT ANG (CATHETERS) IMPLANT
CATH THORACIC 36FR (CATHETERS) IMPLANT
CATH THORACIC 36FR RT ANG (CATHETERS) IMPLANT
CLIP APPLIE ROT 10 11.4 M/L (STAPLE) IMPLANT
CLIP VESOCCLUDE MED 6/CT (CLIP) ×3 IMPLANT
CNTNR URN SCR LID CUP LEK RST (MISCELLANEOUS) ×10 IMPLANT
CONN ST 1/4X3/8  BEN (MISCELLANEOUS) ×6
CONN ST 1/4X3/8 BEN (MISCELLANEOUS) IMPLANT
CONN Y 3/8X3/8X3/8  BEN (MISCELLANEOUS)
CONN Y 3/8X3/8X3/8 BEN (MISCELLANEOUS) IMPLANT
CONT SPEC 4OZ STRL OR WHT (MISCELLANEOUS) ×15
DEFOGGER SCOPE WARMER CLEARIFY (MISCELLANEOUS) ×3 IMPLANT
DERMABOND ADHESIVE PROPEN (GAUZE/BANDAGES/DRESSINGS) ×1
DERMABOND ADVANCED (GAUZE/BANDAGES/DRESSINGS) ×1
DERMABOND ADVANCED .7 DNX12 (GAUZE/BANDAGES/DRESSINGS) ×1 IMPLANT
DERMABOND ADVANCED .7 DNX6 (GAUZE/BANDAGES/DRESSINGS) IMPLANT
DRAIN CHANNEL 28F RND 3/8 FF (WOUND CARE) ×1 IMPLANT
DRAIN CHANNEL 32F RND 10.7 FF (WOUND CARE) IMPLANT
DRAPE ARM DVNC X/XI (DISPOSABLE) ×8 IMPLANT
DRAPE COLUMN DVNC XI (DISPOSABLE) ×2 IMPLANT
DRAPE CV SPLIT W-CLR ANES SCRN (DRAPES) ×3 IMPLANT
DRAPE DA VINCI XI ARM (DISPOSABLE) ×12
DRAPE DA VINCI XI COLUMN (DISPOSABLE) ×3
DRAPE INCISE IOBAN 66X45 STRL (DRAPES) IMPLANT
DRAPE ORTHO SPLIT 77X108 STRL (DRAPES) ×3
DRAPE SURG ORHT 6 SPLT 77X108 (DRAPES) ×2 IMPLANT
DRAPE WARM FLUID 44X44 (DRAPES) ×2 IMPLANT
ELECT BLADE 6.5 EXT (BLADE) ×3 IMPLANT
ELECT REM PT RETURN 9FT ADLT (ELECTROSURGICAL) ×6
ELECTRODE REM PT RTRN 9FT ADLT (ELECTROSURGICAL) ×4 IMPLANT
FELT TEFLON 1X6 (MISCELLANEOUS) ×3 IMPLANT
GAUZE SPONGE 4X4 12PLY STRL (GAUZE/BANDAGES/DRESSINGS) ×4 IMPLANT
GLOVE SURG SIGNA 7.5 PF LTX (GLOVE) ×9 IMPLANT
GLOVE TRIUMPH SURG SIZE 7.5 (KITS) ×6 IMPLANT
GOWN STRL REUS W/ TWL LRG LVL3 (GOWN DISPOSABLE) ×8 IMPLANT
GOWN STRL REUS W/ TWL XL LVL3 (GOWN DISPOSABLE) ×8 IMPLANT
GOWN STRL REUS W/TWL 2XL LVL3 (GOWN DISPOSABLE) ×2 IMPLANT
GOWN STRL REUS W/TWL LRG LVL3 (GOWN DISPOSABLE) ×12
GOWN STRL REUS W/TWL XL LVL3 (GOWN DISPOSABLE) ×18
HEMOSTAT SURGICEL 2X14 (HEMOSTASIS) ×9 IMPLANT
INSERT FOGARTY 61MM (MISCELLANEOUS) IMPLANT
IRRIGATION STRYKERFLOW (MISCELLANEOUS) ×2 IMPLANT
IRRIGATOR STRYKERFLOW (MISCELLANEOUS) ×3
IRRIGATOR SUCT 8 DISP DVNC XI (IRRIGATION / IRRIGATOR) IMPLANT
IRRIGATOR SUCTION 8MM XI DISP (IRRIGATION / IRRIGATOR)
KIT BASIN OR (CUSTOM PROCEDURE TRAY) ×6 IMPLANT
KIT SUCTION CATH 14FR (SUCTIONS) ×3 IMPLANT
KIT TURNOVER KIT B (KITS) ×6 IMPLANT
LOOP VESSEL SUPERMAXI WHITE (MISCELLANEOUS) IMPLANT
NDL HYPO 25GX1X1/2 BEV (NEEDLE) ×2 IMPLANT
NDL SPNL 18GX3.5 QUINCKE PK (NEEDLE) ×2 IMPLANT
NEEDLE HYPO 25GX1X1/2 BEV (NEEDLE) ×3 IMPLANT
NEEDLE SPNL 18GX3.5 QUINCKE PK (NEEDLE) ×3 IMPLANT
NS IRRIG 1000ML POUR BTL (IV SOLUTION) ×12 IMPLANT
PACK CHEST (CUSTOM PROCEDURE TRAY) ×6 IMPLANT
PAD ARMBOARD 7.5X6 YLW CONV (MISCELLANEOUS) ×12 IMPLANT
PASSER SUT SWANSON 36MM LOOP (INSTRUMENTS) IMPLANT
RELOAD STAPLE 45 3.5 BLU DVNC (STAPLE) IMPLANT
RELOAD STAPLER 3.5X45 BLU DVNC (STAPLE) ×14 IMPLANT
SCISSORS LAP 5X35 DISP (ENDOMECHANICALS) IMPLANT
SEAL CANN UNIV 5-8 DVNC XI (MISCELLANEOUS) ×4 IMPLANT
SEAL XI 5MM-8MM UNIVERSAL (MISCELLANEOUS) ×6
SEALANT PROGEL (MISCELLANEOUS) IMPLANT
SEALANT SURG COSEAL 4ML (VASCULAR PRODUCTS) IMPLANT
SEALANT SURG COSEAL 8ML (VASCULAR PRODUCTS) IMPLANT
SET TUBE SMOKE EVAC HIGH FLOW (TUBING) ×1 IMPLANT
SHEARS HARMONIC HDI 20CM (ELECTROSURGICAL) IMPLANT
SPECIMEN JAR MEDIUM (MISCELLANEOUS) IMPLANT
SPONGE INTESTINAL PEANUT (DISPOSABLE) IMPLANT
SPONGE TONSIL TAPE 1 RFD (DISPOSABLE) ×3 IMPLANT
STAPLER 45 DA VINCI SURE FORM (STAPLE) ×3
STAPLER 45 SUREFORM DVNC (STAPLE) IMPLANT
STAPLER CANNULA SEAL DVNC XI (STAPLE) ×4 IMPLANT
STAPLER CANNULA SEAL XI (STAPLE) ×6
STAPLER RELOAD 3.5X45 BLU DVNC (STAPLE) ×14
STAPLER RELOAD 3.5X45 BLUE (STAPLE) ×21
STAPLER VISISTAT 35W (STAPLE) IMPLANT
STOPCOCK 4 WAY LG BORE MALE ST (IV SETS) ×2 IMPLANT
SUT PDS AB 3-0 SH 27 (SUTURE) IMPLANT
SUT PROLENE 2 0 MH 48 (SUTURE) IMPLANT
SUT PROLENE 2 0 SH DA (SUTURE) IMPLANT
SUT PROLENE 3 0 SH 48 (SUTURE) IMPLANT
SUT PROLENE 4 0 RB 1 (SUTURE)
SUT PROLENE 4 0 SH DA (SUTURE) IMPLANT
SUT PROLENE 4-0 RB1 .5 CRCL 36 (SUTURE) ×2 IMPLANT
SUT SILK  1 MH (SUTURE) ×9
SUT SILK 1 MH (SUTURE) ×6 IMPLANT
SUT SILK 1 TIES 10X30 (SUTURE) IMPLANT
SUT SILK 2 0 SH (SUTURE) IMPLANT
SUT SILK 2 0SH CR/8 30 (SUTURE) ×1 IMPLANT
SUT SILK 3 0 SH 30 (SUTURE) IMPLANT
SUT SILK 3 0SH CR/8 30 (SUTURE) IMPLANT
SUT VIC AB 1 CTX 18 (SUTURE) IMPLANT
SUT VIC AB 1 CTX 36 (SUTURE)
SUT VIC AB 1 CTX36XBRD ANBCTR (SUTURE) ×1 IMPLANT
SUT VIC AB 2-0 CTX 36 (SUTURE) IMPLANT
SUT VIC AB 3-0 MH 27 (SUTURE) IMPLANT
SUT VIC AB 3-0 X1 27 (SUTURE) ×11 IMPLANT
SUT VICRYL 0 TIES 12 18 (SUTURE) ×3 IMPLANT
SUT VICRYL 0 UR6 27IN ABS (SUTURE) ×6 IMPLANT
SUT VICRYL 2 TP 1 (SUTURE) ×2 IMPLANT
SWAB CULTURE ESWAB REG 1ML (MISCELLANEOUS) IMPLANT
SYR 10ML LL (SYRINGE) ×2 IMPLANT
SYR 20ML LL LF (SYRINGE) ×2 IMPLANT
SYR 30ML LL (SYRINGE) ×2 IMPLANT
SYR 50ML LL SCALE MARK (SYRINGE) ×2 IMPLANT
SYSTEM RETRIEVAL ANCHOR 8 (MISCELLANEOUS) ×2 IMPLANT
SYSTEM SAHARA CHEST DRAIN ATS (WOUND CARE) ×6 IMPLANT
TAPE CLOTH 4X10 WHT NS (GAUZE/BANDAGES/DRESSINGS) ×3 IMPLANT
TAPE CLOTH SURG 4X10 WHT LF (GAUZE/BANDAGES/DRESSINGS) ×1 IMPLANT
TIP APPLICATOR SPRAY EXTEND 16 (VASCULAR PRODUCTS) IMPLANT
TOWEL GREEN STERILE (TOWEL DISPOSABLE) ×9 IMPLANT
TRAY FOLEY MTR SLVR 16FR STAT (SET/KITS/TRAYS/PACK) ×2 IMPLANT
TRAY FOLEY SLVR 16FR LF STAT (SET/KITS/TRAYS/PACK) ×3 IMPLANT
TROCAR PORT AIRSEAL 8X100 (TROCAR) ×1 IMPLANT
TROCAR XCEL 12X100 BLDLESS (ENDOMECHANICALS) ×2 IMPLANT
TUBING EXTENTION W/L.L. (IV SETS) IMPLANT
WATER STERILE IRR 1000ML POUR (IV SOLUTION) ×6 IMPLANT

## 2020-05-18 NOTE — Op Note (Signed)
NAME: Aaron Griffin, Aaron Griffin MEDICAL RECORD GO:[id number] ACCOUNT [id number] DATE OF BIRTH:1965-09-28 FACILITY: [facility] LOCATION: [facility] PHYSICIAN:Tashae Inda Chaya Jan, MD  OPERATIVE REPORT  DATE OF PROCEDURE:  05/18/2020  PREOPERATIVE DIAGNOSIS:  Giant bullae of right lung and right apical blebs.  POSTOPERATIVE DIAGNOSIS:  Giant bullae of right lung and right apical blebs.  PROCEDURE:   1.  Xi robotic video-assisted right thoracoscopy, bullectomy and apical bleb resection. 2.  Intercostal nerve blocks at levels 3 through 10.  SURGEON:  Modesto Charon, MD  ASSISTANT:  Jadene Pierini, PA-C  ANESTHESIA:  General.  FINDINGS:  Giant bullae medially along the right upper lobe with adhesions to the mediastinum, apical blebs.  CLINICAL NOTE:  Aaron Griffin is a 54 year old man with a large bullae in his right lung.  He recently had an infection involving the bullae that was treated with antibiotics.  He has now recovered from that and presents for surgical resection because of  the risk of recurrent infection.  The indications, risks, benefits, and alternatives were discussed in detail with the patient.  He understood and accepted the risks and agreed to proceed.  OPERATIVE NOTE:  Aaron Griffin was brought to the preoperative holding area on 05/18/2020.  Anesthesia established central venous access and placed an arterial blood pressure monitoring line.  He was taken to the Operating Room, anesthetized and intubated.   Intravenous antibiotics were administered.  A Foley catheter was placed.  Sequential compression devices were placed on the calves for DVT prophylaxis.  A double lumen endotracheal tube was used for intubation.  He was placed in a left lateral decubitus position and the right chest was prepped and draped in the usual sterile fashion.  Single-lung ventilation of the left lung was initiated and was tolerated well throughout the procedure.  A timeout was performed.  A solution containing 20  mL of liposomal bupivacaine, 30 mL of 0.5% bupivacaine and 50 mL of saline was prepared.  This was used for local at the incision sites, as well as the intercostal nerve blocks.  An incision was made in  the 8th interspace in the midaxillary line.  An 8 mm port was inserted.  The thoracoscope was advanced into the chest.  There were no adhesions to the lateral chest wall.  Carbon dioxide was insufflated per protocol.  A 12 mm port was placed more  anteriorly in the 8th interspace and an AirSeal port was placed in the 10th interspace centered between the 2 anterior ports.  Intercostal nerve blocks then were performed from the 3rd to the 10th interspace.  A needle was inserted from a posterior  approach and 10 mL of the bupivacaine solution was injected into a subpleural plane at each of those levels.  Additional ports then were placed in the 8th interspace posteriorly, one 8 mm port and a 12 mm port.  Instruments were inserted under  thoracoscopic visualization.  From the console, the chest was inspected.  The area of the bullae anteriorly was easily visualized and there were adhesions to the mediastinum, but no adhesions to the chest wall.  More apically, there was a small apical adhesion and a  small group of  blebs at the apex.  These were not in approximation to the giant bullae.  The adhesions around the bullae then were taken down.  There were some adhesions to the middle lobe.  These were thin, filmy adhesions that were divided with cautery.  Along the  mediastinum, the phrenic nerve was identified and was preserved.  No cautery was used in its vicinity.  There was some bleeding with takedown of the mediastinal adhesions.  This was relatively mild and where needed was controlled with bipolar cautery.  A  bullectomy then was performed with sequential firings of the robotic stapler using blue cartridges.  The specimen was placed in the anterior portion of the chest.  The apex then was grasped and the  apical blebs were resected, again with sequential  firings of the robotic stapler.  Both specimens then were placed into an endoscopic retrieval bag and removed through the assistant port.  Staple lines were inspected for hemostasis and there was no ongoing significant bleeding from the mediastinum.  The  sponges that had been inserted were removed.  The robot was undocked.  The chest was copiously irrigated with warm saline.  A 28-French Blake drain was inserted through the anterior 8th interspace incision and directed to the apex.  It was secured to  the skin with a #1 silk suture.  Dual-lung ventilation was resumed.  The scope was removed.  The remaining port was removed.  The remaining incisions were closed with 0 Vicryl fascial sutures, followed by 3-0 Vicryl subcuticular sutures.  Dermabond was  applied.  The chest tube was placed to a Pleur-evac unit on waterseal.  The patient then was extubated in the operating room and taken to the [facility] in good condition.  All sponge, needle and instrument counts were correct.  VN/NUANCE  D:05/18/2020 T:05/18/2020 JOB:[id number]

## 2020-05-18 NOTE — Brief Op Note (Addendum)
05/18/2020  10:16 AM  PATIENT:  Roma Kayser  54 y.o. male  PRE-OPERATIVE DIAGNOSIS:  GIANT BULLA RIGHT LUNG, APICAL BLEBS  POST-OPERATIVE DIAGNOSIS: GIANT BULLA RIGHT LUNG, APICAL BLEBS  PROCEDURE:   Xi ROBOTIC ASSISTED RIGHT THORACOSCOPY RIGHT UPPER LOBE BULLECTOMY RESECTION OF APICAL BLEBS INTERCOSTAL NERVE BLOCKS  SURGEON:  Surgeon(s) and Role:    * Melrose Nakayama, MD - Primary  PHYSICIAN ASSISTANT: WAYNE GOLD PA-C  ANESTHESIA:   general  EBL:  50 mL   BLOOD ADMINISTERED:none  DRAINS: (1) Blake drain(s) in the RIGHT HEMITHORAX   LOCAL MEDICATIONS USED:   EXPAREL  SPECIMEN:  Source of Specimen:  WEDGE RESECTION X 2  DISPOSITION OF SPECIMEN:  PATHOLOGY  COUNTS:  YES  TOURNIQUET:  * No tourniquets in log *  DICTATION: .Other Dictation: Dictation Number PENDING  PLAN OF CARE: Admit to inpatient   PATIENT DISPOSITION:  PACU - hemodynamically stable.   Delay start of Pharmacological VTE agent (>24hrs) due to surgical blood loss or risk of bleeding: yes  COMPLICATIONS: NO KNOWN

## 2020-05-18 NOTE — Interval H&P Note (Signed)
History and Physical Interval Note:  05/18/2020 7:51 AM  Aaron Griffin  has presented today for surgery, with the diagnosis of GIANT BULLA RIGHT LUNG.  The various methods of treatment have been discussed with the patient and family. After consideration of risks, benefits and other options for treatment, the patient has consented to  Procedure(s): XI ROBOTIC ASSISTED THORASCOPY (Right) RESECTION OF BULLA/BLEBS (Right) possible THORACOTOMY (Right) as a surgical intervention.  The patient's history has been reviewed, patient examined, no change in status, stable for surgery.  I have reviewed the patient's chart and labs.  Questions were answered to the patient's satisfaction.     Melrose Nakayama

## 2020-05-18 NOTE — Progress Notes (Signed)
Transported patient to 2C03, vital signs stable, had patient stand at the side of bed and he walked in place for a few minutes-CT dumped 110 during that time, patient back in bed, call bell in reach, receiving RN at bedside, no questions or concerns from receiving RN or patient, wife is in [facility] waiting room, receiving RN aware.  Rowe Pavy, RN

## 2020-05-18 NOTE — Anesthesia Procedure Notes (Signed)
Procedure Name: Intubation Date/Time: 05/18/2020 8:10 AM Performed by: Dorthea Cove, CRNA Pre-anesthesia Checklist: Patient identified, Emergency Drugs available, Suction available and Patient being monitored Patient Re-evaluated:Patient Re-evaluated prior to induction Oxygen Delivery Method: Circle system utilized Preoxygenation: Pre-oxygenation with 100% oxygen Induction Type: IV induction Ventilation: Mask ventilation without difficulty and Oral airway inserted - appropriate to patient size Laryngoscope Size: Mac and 4 Grade View: Grade II Tube type: Oral Endobronchial tube: Left and Double lumen EBT and 39 Fr Number of attempts: 1 Airway Equipment and Method: Stylet and Oral airway Placement Confirmation: ETT inserted through vocal cords under direct vision,  positive ETCO2 and breath sounds checked- equal and bilateral Tube secured with: Tape Dental Injury: Teeth and Oropharynx as per pre-operative assessment

## 2020-05-18 NOTE — Anesthesia Procedure Notes (Signed)
Arterial Line Insertion Start/End10/02/2020 7:15 AM, 05/18/2020 7:25 AM Performed by: Dorthea Cove, CRNA, CRNA  Patient location: Pre-op. Preanesthetic checklist: patient identified, IV checked, site marked, risks and benefits discussed, surgical consent, monitors and equipment checked, pre-op evaluation, timeout performed and anesthesia consent Lidocaine 1% used for infiltration radial was placed Catheter size: 20 Fr Hand hygiene performed  and maximum sterile barriers used   Attempts: 1 Procedure performed without using ultrasound guided technique. Following insertion, dressing applied and Biopatch. Post procedure assessment: normal and unchanged  Patient tolerated the procedure well with no immediate complications.

## 2020-05-18 NOTE — Transfer of Care (Signed)
Immediate Anesthesia Transfer of Care Note  Patient: Aaron Griffin  Procedure(s) Performed: XI ROBOTIC ASSISTED THORASCOPY (Right ) BULLECTOMY RESECTION OF APICAL BLEBS (Right Chest) INTERCOSTAL NERVE BLOCK (Right Chest)  Patient Location: PACU  Anesthesia Type:General  Level of Consciousness: awake and alert   Airway & Oxygen Therapy: Patient connected to face mask oxygen  Post-op Assessment: Report given to RN  Post vital signs: Reviewed and stable  Last Vitals:  Vitals Value Taken Time  BP 127/73 05/18/20 1033  Temp    Pulse 94 05/18/20 1036  Resp 21 05/18/20 1036  SpO2 100 % 05/18/20 1036  Vitals shown include unvalidated device data.  Last Pain:  Vitals:   05/18/20 0645  TempSrc:   PainSc: 0-No pain         Complications: No complications documented.

## 2020-05-18 NOTE — Anesthesia Postprocedure Evaluation (Signed)
Anesthesia Post Note  Patient: Aaron Griffin  Procedure(s) Performed: XI ROBOTIC ASSISTED THORASCOPY (Right ) BULLECTOMY RESECTION OF APICAL BLEBS (Right Chest) INTERCOSTAL NERVE BLOCK (Right Chest)     Patient location during evaluation: PACU Anesthesia Type: General Level of consciousness: awake and alert, patient cooperative and oriented Pain management: pain level controlled Vital Signs Assessment: post-procedure vital signs reviewed and stable Respiratory status: spontaneous breathing, nonlabored ventilation and respiratory function stable Cardiovascular status: blood pressure returned to baseline and stable Postop Assessment: no apparent nausea or vomiting Anesthetic complications: yes (R eye irritation) Comments: Pt c/o R eye discomfort. R eye is reddened, relieved with NS rinse rinse and then Ketorolac drops      Last Vitals:  Vitals:   05/18/20 1415 05/18/20 1505  BP:  120/76  Pulse: 97 97  Resp: (!) 23 16  Temp:    SpO2: 96% 96%    Last Pain:  Vitals:   05/18/20 1505  TempSrc:   PainSc: 0-No pain                 Zhanna Melin,E. Okie Jansson

## 2020-05-19 ENCOUNTER — Encounter (HOSPITAL_COMMUNITY): Payer: Self-pay | Admitting: Thoracic Surgery (Cardiothoracic Vascular Surgery)

## 2020-05-19 ENCOUNTER — Inpatient Hospital Stay (HOSPITAL_COMMUNITY): Payer: 59

## 2020-05-19 LAB — SURGICAL PATHOLOGY

## 2020-05-19 LAB — CBC
HCT: 34.6 % — ABNORMAL LOW (ref 39.0–52.0)
Hemoglobin: 11.8 g/dL — ABNORMAL LOW (ref 13.0–17.0)
MCH: 31.5 pg (ref 26.0–34.0)
MCHC: 34.1 g/dL (ref 30.0–36.0)
MCV: 92.3 fL (ref 80.0–100.0)
Platelets: 268 10*3/uL (ref 150–400)
RBC: 3.75 MIL/uL — ABNORMAL LOW (ref 4.22–5.81)
RDW: 12.5 % (ref 11.5–15.5)
WBC: 15 10*3/uL — ABNORMAL HIGH (ref 4.0–10.5)
nRBC: 0 % (ref 0.0–0.2)

## 2020-05-19 LAB — BASIC METABOLIC PANEL
Anion gap: 10 (ref 5–15)
BUN: 14 mg/dL (ref 6–20)
CO2: 24 mmol/L (ref 22–32)
Calcium: 8.7 mg/dL — ABNORMAL LOW (ref 8.9–10.3)
Chloride: 102 mmol/L (ref 98–111)
Creatinine, Ser: 1.17 mg/dL (ref 0.61–1.24)
GFR calc non Af Amer: >60 mL/min (ref >60)
Glucose, Bld: 147 mg/dL — ABNORMAL HIGH (ref 70–99)
Potassium: 4.1 mmol/L (ref 3.5–5.1)
Sodium: 136 mmol/L (ref 135–145)

## 2020-05-19 MED ORDER — OXYCODONE HCL 5 MG PO TABS: 5.0000 mg | ORAL_TABLET | ORAL | Status: DC | PRN

## 2020-05-19 MED ORDER — LUNG SURGERY BOOK
Freq: Once | Status: AC
  Filled 2020-05-19: qty 1

## 2020-05-19 MED ORDER — OXYCODONE HCL 5 MG/5ML PO SOLN
5.0000 mg | ORAL | Status: DC | PRN
  Administered 2020-05-19: 10 mg via ORAL
  Filled 2020-05-19 (×2): qty 10

## 2020-05-19 MED ORDER — DIPHENHYDRAMINE HCL 12.5 MG/5ML PO ELIX
50.0000 mg | ORAL_SOLUTION | Freq: Every day | ORAL | Status: DC
  Administered 2020-05-19 – 2020-05-20 (×2): 50 mg via ORAL
  Filled 2020-05-19 (×2): qty 20

## 2020-05-19 NOTE — Discharge Summary (Addendum)
Physician Discharge Summary  Patient ID: MAALIK PINN MRN: [id number] DOB/AGE: 52-Jun-1967 52 y.o.  Admit date: 05/18/2020 Discharge date: 05/21/2020  Admission Diagnoses:  Large bullae of right lung History of pneumonia Obstructive sleep apnea Remote history of tobacco abuse   Discharge Diagnoses:   Large bullae of right lung S/P resection on right lung bullae and apical blebs History of pneumonia Obstructive sleep apnea Remote history of tobacco abuse    Discharged Condition: stable   History of Present Illness:  Aaron Griffin is a 54 year old man with a history of remote tobacco use, sleep apnea, aortic atherosclerosis, and a giant bulla of the right lung.  He recently had an infection of the bulla and pneumonia of the surrounding lung in the right upper lobe.  That was treated with antibiotics.  He had a good clinical response.  His CT today shows an excellent radiographic response as well.  I reviewed the images with Mr. and Mrs. Ragone.  We discussed options of surgical resection versus conservative management.  In my opinion, he is highly likely to have recurrent infections in that bulla.  It may be that those infections can be treated with oral antibiotics as this most recent one was, but there is always the risk that he could become seriously ill.  They do understand there is no way to know for sure.  I recommended that we go ahead and resect that bulla along with the apical blebs in the right lung.   I discussed the proposed surgical procedure with them.  We would plan to do a robotic approach for resection of the bulla and blebs.  They do understand it might be necessary to convert to a thoracotomy if there is extensive scarring in the chest.  I informed them of the general nature of the procedure including the incisions to be used, the need for general anesthesia, the use of drains to postoperatively, the expected hospital stay, and the overall recovery.  I informed  him of the indications, risks, benefits, and alternatives.  They understand the risks include, but not limited to death, MI, DVT, PE, bleeding, possible need for transfusion, infection, prolonged air leak, cardiac arrhythmias, as well as possibility of other unforeseeable complications.  He wishes to proceed.  Hospital Course:  Mr. Barnette was admitted for elective surgery on 05/18/20.  He was prepared and taken to the OR where robotic-assisted right thoracoscopy was performed with resection of the giant bullae and apical blebs. Intercostal nerve block was performed as well. Following the procedure, he was extubated and recovered in the PACU and later transferred to [facility]. Pain was controlled with multiple IV and parenteral agents. On the first post-op day, he had a loculated fluid collection at the right apex that drained spontaneously. He had no air leak, the chest tube was left to water seal.  He was mobilized routinely and made satisfactory progress. thje chest tube was removed on 05/20/20 and follow up CXR was satisfactory with no PTX and improvement in the right upper zone density. He was maintaining acceptable O2 sats on RA. The incisions were intact and dry.     Consults: None  Significant Diagnostic Studies:   CT CHEST WITHOUT CONTRAST  TECHNIQUE: Multidetector CT imaging of the chest was performed following the standard protocol without IV contrast.  COMPARISON:  03/23/2020 and 02/21/2015.  FINDINGS: Cardiovascular: Aortic and branch vessel atherosclerosis. Tortuous thoracic aorta. Normal heart size, without pericardial effusion.  Mediastinum/Nodes: No mediastinal or definite hilar adenopathy, given  limitations of unenhanced CT.  Lungs/Pleura: No pleural fluid. Moderate paraseptal and mild centrilobular emphysema.  The right upper lobe ground-glass opacities have resolved.  Again identified is a right upper lobe dominant bulla/bleb. Measures on the  order of 5.5 x 2.2 cm on 82/8. Decreased from 6.6 x 3.9 cm on the prior exam (when remeasured).  Significant decrease in dependent layering fluid, including on 84/8. Improvement in wall thickening.  Upper Abdomen: Low-density left hepatic lobe subcentimeter lesions are likely cysts. Normal imaged portions of the spleen, stomach, pancreas, adrenal glands, kidneys, gallbladder.  Musculoskeletal: Superior endplate compression deformity at T6 is similar, mild.  IMPRESSION: 1. Resolution of previously described right upper lobe ground-glass opacity, likely infection. 2. Improved appearance of the dominant right-sided bulla/bleb, with decreased layering fluid and wall thickening. Likely resolving superinfection. 3. Aortic atherosclerosis (ICD10-I70.0) and emphysema (ICD10-J43.9).   Electronically Signed   By: Abigail Miyamoto M.D.   On: 05/09/2020 15:36   Treatments:   OPERATIVE REPORT  DATE OF PROCEDURE:  05/18/2020  PREOPERATIVE DIAGNOSIS:  Giant bullae of right lung and right apical blebs.  POSTOPERATIVE DIAGNOSIS:  Giant bullae of right lung and right apical blebs.  PROCEDURE:   1.  Xi robotic video-assisted right thoracoscopy, bullectomy and apical bleb resection. 2.  Intercostal nerve blocks at levels 3 through 10.  SURGEON:  Modesto Charon, MD  ASSISTANT:  Jadene Pierini, PA-C  ANESTHESIA:  General.  FINDINGS:  Giant bullae medially along the right upper lobe with adhesions to the mediastinum, apical blebs.  CLINICAL NOTE:  The patient is a 54 year old man with a large bullae in his right lung.  He recently had an infection involving the bullae that was treated with antibiotics.  He has now recovered from that and presents for surgical resection because of  the risk of recurrent infection.  The indications, risks, benefits, and alternatives were discussed in detail with the patient.  He understood and accepted the risks and agreed to  proceed.  Discharge Exam: Blood pressure 131/82, pulse 78, temperature 98.1 F (36.7 C), temperature source Oral, resp. rate 18, height 5\' 9"  (1.753 m), weight 97.9 kg, SpO2 95 %.  General appearance: alert, cooperative and mild distress Neurologic: intact Heart: regular rate and rhythm Lungs: Breath sounds are clear. No PTX on CXR post CT removal. Partial clearing of right upper lung zone density Wound: The port incisions are intact and dry.   Disposition:    Allergies as of 05/21/2020   No Known Allergies     Medication List    TAKE these medications   oxyCODONE-acetaminophen 5-325 MG tablet Commonly known as: Percocet Take 1 tablet by mouth every 4 (four) hours as needed for up to 5 days for severe pain.   rosuvastatin 10 MG tablet Commonly known as: Crestor Take 1 tablet (10 mg total) by mouth daily.   ZzzQuil 50 MG/30ML Liqd Generic drug: diphenhydrAMINE HCl Take 50 mg by mouth at bedtime.       Follow-up Information    Melrose Nakayama, MD. Go on 06/06/2020.   Specialty: Cardiothoracic Surgery Why: Your appointment is at 10:45am.  Please arrive 30 min early for a chest x-ray to be performed by [facility] located on the first floor of the same building.  Contact information: [address] [place] [address] [phone]               Signed: Antony Odea, PA-C 05/21/2020, 9:37 AM

## 2020-05-19 NOTE — Progress Notes (Deleted)
      [address]       [place] [address]             [phone]      1 Day Post-Op Procedure(s) (LRB): XI ROBOTIC ASSISTED THORASCOPY (Right) BULLECTOMY RESECTION OF APICAL BLEBS (Right) INTERCOSTAL NERVE BLOCK (Right) Subjective: Awake and alert, having some pain at the incisions, says "pain medicine wore off".   Objective: Vital signs in last 24 hours: Temp:  [97.7 F (36.5 C)-98.8 F (37.1 C)] 98.2 F (36.8 C) (10/08 0416) Pulse Rate:  [74-99] 74 (10/08 0416) Cardiac Rhythm: Normal sinus rhythm;Heart block (10/08 0700) Resp:  [14-24] 18 (10/08 0416) BP: (105-129)/(62-85) 123/71 (10/08 0416) SpO2:  [96 %-100 %] 100 % (10/08 0416) Arterial Line BP: (132-147)/(64-76) 132/65 (10/07 1345)     Intake/Output from previous day: 10/07 0701 - 10/08 0700 In: 1800 [I.V.:1100; IV Piggyback:700] Out: 3164 [Urine:2910; Blood:50; Chest Tube:204] Intake/Output this shift: No intake/output data recorded.  General appearance: alert, cooperative and mild distress Neurologic: intact Heart: regular rate and rhythm Lungs: Breath sounds clear, CT drainage 257ml since surgery with bloody drainage in the tube. No air leak, chest tube is on water seal. CXR shows good aeration both sides, apical space on the right.    Lab Results: Recent Labs    05/16/20 0826 05/19/20 0026  WBC 8.8 15.0*  HGB 13.8 11.8*  HCT 42.2 34.6*  PLT 281 268   BMET:  Recent Labs    05/16/20 0826 05/19/20 0026  NA 138 136  K 4.0 4.1  CL 106 102  CO2 20* 24  GLUCOSE 132* 147*  BUN 12 14  CREATININE 1.15 1.17  CALCIUM 9.7 8.7*    PT/INR:  Recent Labs    05/16/20 0826  LABPROT 13.2  INR 1.0   ABG    Component Value Date/Time   PHART 7.436 05/16/2020 0845   HCO3 24.2 05/16/2020 0845   O2SAT 98.2 05/16/2020 0845   CBG (last 3)  No results for input(s): GLUCAP in the last 72 hours.  Assessment/Plan: S/P Procedure(s) (LRB): XI ROBOTIC ASSISTED THORASCOPY  (Right) BULLECTOMY RESECTION OF APICAL BLEBS (Right) INTERCOSTAL NERVE BLOCK (Right)  -POD1 robotic-assisted right pleural bleb resection. No air leak and minimal drainage. Leave tube to water seal. Mobilize.   Toradol, tramadol, fentanyl and OxyIR have been ordered.  -DVT PPX- enoxaparin SQ daily,  started last evening.    LOS: 1 day    Antony Odea, PA-C  05/19/2020

## 2020-05-19 NOTE — Plan of Care (Signed)
°  Problem: Education: °Goal: Knowledge of disease or condition will improve °Outcome: Progressing °Goal: Knowledge of the prescribed therapeutic regimen will improve °Outcome: Progressing °  °Problem: Activity: °Goal: Risk for activity intolerance will decrease °Outcome: Progressing °  °Problem: Cardiac: °Goal: Will achieve and/or maintain hemodynamic stability °Outcome: Progressing °  °Problem: Clinical Measurements: °Goal: Postoperative complications will be avoided or minimized °Outcome: Progressing °  °Problem: Respiratory: °Goal: Respiratory status will improve °Outcome: Progressing °  °Problem: Pain Management: °Goal: Pain level will decrease °Outcome: Progressing °  °Problem: Skin Integrity: °Goal: Wound healing without signs and symptoms infection will improve °Outcome: Progressing °  °

## 2020-05-19 NOTE — Plan of Care (Signed)
  Problem: Education: Goal: Knowledge of disease or condition will improve Outcome: Progressing Goal: Knowledge of the prescribed therapeutic regimen will improve Outcome: Progressing   Problem: Activity: Goal: Risk for activity intolerance will decrease Outcome: Progressing   Problem: Cardiac: Goal: Will achieve and/or maintain hemodynamic stability Outcome: Progressing   Problem: Clinical Measurements: Goal: Postoperative complications will be avoided or minimized Outcome: Progressing   

## 2020-05-19 NOTE — Progress Notes (Signed)
1 Day Post-Op Procedure(s) (LRB): XI ROBOTIC ASSISTED THORASCOPY (Right) BULLECTOMY RESECTION OF APICAL BLEBS (Right) INTERCOSTAL NERVE BLOCK (Right) Subjective: Some incisional discomfort  Objective: Vital signs in last 24 hours: Temp:  [97.7 F (36.5 C)-98.8 F (37.1 C)] 98.2 F (36.8 C) (10/08 0810) Pulse Rate:  [74-99] 83 (10/08 0810) Cardiac Rhythm: Normal sinus rhythm;Heart block (10/08 0700) Resp:  [14-24] 18 (10/08 0416) BP: (105-149)/(62-85) 149/85 (10/08 0810) SpO2:  [95 %-100 %] 95 % (10/08 0810) Arterial Line BP: (132-147)/(64-76) 132/65 (10/07 1345)  Hemodynamic parameters for last 24 hours:    Intake/Output from previous day: 10/07 0701 - 10/08 0700 In: 1800 [I.V.:1100; IV Piggyback:700] Out: 3164 [Urine:2910; Blood:50; Chest Tube:204] Intake/Output this shift: No intake/output data recorded.  General appearance: alert, cooperative and no distress Neurologic: intact Heart: regular rate and rhythm Lungs: diminished breath sounds bibasilar no air leak  Lab Results: Recent Labs    05/16/20 0826 05/19/20 0026  WBC 8.8 15.0*  HGB 13.8 11.8*  HCT 42.2 34.6*  PLT 281 268   BMET:  Recent Labs    05/16/20 0826 05/19/20 0026  NA 138 136  K 4.0 4.1  CL 106 102  CO2 20* 24  GLUCOSE 132* 147*  BUN 12 14  CREATININE 1.15 1.17  CALCIUM 9.7 8.7*    PT/INR:  Recent Labs    05/16/20 0826  LABPROT 13.2  INR 1.0   ABG    Component Value Date/Time   PHART 7.436 05/16/2020 0845   HCO3 24.2 05/16/2020 0845   O2SAT 98.2 05/16/2020 0845   CBG (last 3)  No results for input(s): GLUCAP in the last 72 hours.  Assessment/Plan: S/P Procedure(s) (LRB): XI ROBOTIC ASSISTED THORASCOPY (Right) BULLECTOMY RESECTION OF APICAL BLEBS (Right) INTERCOSTAL NERVE BLOCK (Right) - Doing well POD # 1  No air leak Only 200 from CT overnight, but drained about 200 ml of bloody fluid after getting up to chair, likely the apical fluid noted on CXR this AM, will leave  tube in for now SCD + enoxaparin Ambulate IS for atelectasis Toradol, acetaminophen, PRN oxycodone for pain CPAP at night   LOS: 1 day    Melrose Nakayama 05/19/2020

## 2020-05-20 ENCOUNTER — Inpatient Hospital Stay (HOSPITAL_COMMUNITY): Payer: 59

## 2020-05-20 LAB — COMPREHENSIVE METABOLIC PANEL
ALT: 23 U/L (ref 0–44)
AST: 26 U/L (ref 15–41)
Albumin: 3.3 g/dL — ABNORMAL LOW (ref 3.5–5.0)
Alkaline Phosphatase: 45 U/L (ref 38–126)
Anion gap: 9 (ref 5–15)
BUN: 12 mg/dL (ref 6–20)
CO2: 26 mmol/L (ref 22–32)
Calcium: 8.6 mg/dL — ABNORMAL LOW (ref 8.9–10.3)
Chloride: 106 mmol/L (ref 98–111)
Creatinine, Ser: 1.06 mg/dL (ref 0.61–1.24)
GFR, Estimated: >60 mL/min (ref >60)
Glucose, Bld: 118 mg/dL — ABNORMAL HIGH (ref 70–99)
Potassium: 4.3 mmol/L (ref 3.5–5.1)
Sodium: 141 mmol/L (ref 135–145)
Total Bilirubin: 0.4 mg/dL (ref 0.3–1.2)
Total Protein: 6.1 g/dL — ABNORMAL LOW (ref 6.5–8.1)

## 2020-05-20 LAB — CBC
HCT: 35.4 % — ABNORMAL LOW (ref 39.0–52.0)
Hemoglobin: 11.7 g/dL — ABNORMAL LOW (ref 13.0–17.0)
MCH: 31 pg (ref 26.0–34.0)
MCHC: 33.1 g/dL (ref 30.0–36.0)
MCV: 93.7 fL (ref 80.0–100.0)
Platelets: 232 10*3/uL (ref 150–400)
RBC: 3.78 MIL/uL — ABNORMAL LOW (ref 4.22–5.81)
RDW: 12.9 % (ref 11.5–15.5)
WBC: 12.4 10*3/uL — ABNORMAL HIGH (ref 4.0–10.5)
nRBC: 0 % (ref 0.0–0.2)

## 2020-05-20 MED ORDER — POLYETHYLENE GLYCOL 3350 17 G PO PACK: 17.0000 g | PACK | Freq: Every day | ORAL | Status: DC | PRN

## 2020-05-20 NOTE — Progress Notes (Signed)
      [address]       [place] [address]             [phone]        2 Days Post-Op Procedure(s) (LRB): XI ROBOTIC ASSISTED THORASCOPY (Right) BULLECTOMY RESECTION OF APICAL BLEBS (Right) INTERCOSTAL NERVE BLOCK (Right) Subjective: Says he feel better than yesterday.  Pain controlled with Toradol and Tylenol, not using Oxy today.  Breathing is about the same.  No BM yet.   Objective: Vital signs in last 24 hours: Temp:  [98.1 F (36.7 C)-98.6 F (37 C)] 98.1 F (36.7 C) (10/09 0756) Pulse Rate:  [71-78] 72 (10/09 0756) Cardiac Rhythm: Normal sinus rhythm (10/09 0700) Resp:  [18-20] 18 (10/09 0756) BP: (111-134)/(74-88) 127/77 (10/09 0756) SpO2:  [95 %-98 %] 95 % (10/09 0756) Weight:  [97.8 kg] 97.8 kg (10/09 0538)    Intake/Output from previous day: 10/08 0701 - 10/09 0700 In: -  Out: 2220 [Urine:2000; Chest Tube:220] Intake/Output this shift: No intake/output data recorded.  General appearance: alert, cooperative and mild distress Neurologic: intact Heart: regular rate and rhythm Lungs: mild exp wheeze on the right, clear on the left. No air leak. CT drainage 220 ml past 24 hours. CXR continues to show some right apical density c/w loculated fluid. No PTX.  Wound: The port incisions are intact and dry.   Lab Results: Recent Labs    05/19/20 0026 05/20/20 0107  WBC 15.0* 12.4*  HGB 11.8* 11.7*  HCT 34.6* 35.4*  PLT 268 232   BMET:  Recent Labs    05/19/20 0026 05/20/20 0107  NA 136 141  K 4.1 4.3  CL 102 106  CO2 24 26  GLUCOSE 147* 118*  BUN 14 12  CREATININE 1.17 1.06  CALCIUM 8.7* 8.6*    PT/INR: No results for input(s): LABPROT, INR in the last 72 hours. ABG    Component Value Date/Time   PHART 7.436 05/16/2020 0845   HCO3 24.2 05/16/2020 0845   O2SAT 98.2 05/16/2020 0845   CBG (last 3)  No results for input(s): GLUCAP in the last 72 hours.  Assessment/Plan: S/P Procedure(s) (LRB): XI ROBOTIC ASSISTED  THORASCOPY (Right) BULLECTOMY RESECTION OF APICAL BLEBS (Right) INTERCOSTAL NERVE BLOCK (Right)  -POD-2 robotic-assisted resection of large right pleural bullae and blebs. Leave the chest tube today, would like to see the right apical density clear more befor removing the tube.  Continue working on pulmonary hygiene, mobility (only walked once yesterday).   -DVT PPX- on daiily Lovenox [place].   LOS: 2 days    Antony Odea, PA-C [phone] 05/20/2020

## 2020-05-20 NOTE — Progress Notes (Signed)
2 Days Post-Op Procedure(s) (LRB): XI ROBOTIC ASSISTED THORASCOPY (Right) BULLECTOMY RESECTION OF APICAL BLEBS (Right) INTERCOSTAL NERVE BLOCK (Right) Subjective: Pain at chest tube  Objective: Vital signs in last 24 hours: Temp:  [98.1 F (36.7 C)-98.6 F (37 C)] 98.1 F (36.7 C) (10/09 0756) Pulse Rate:  [71-78] 72 (10/09 0756) Cardiac Rhythm: Normal sinus rhythm (10/09 0700) Resp:  [18-20] 18 (10/09 0756) BP: (111-134)/(74-88) 127/77 (10/09 0756) SpO2:  [95 %-98 %] 95 % (10/09 0756) Weight:  [97.8 kg] 97.8 kg (10/09 0538)  Hemodynamic parameters for last 24 hours:    Intake/Output from previous day: 10/08 0701 - 10/09 0700 In: -  Out: 2220 [Urine:2000; Chest Tube:220] Intake/Output this shift: No intake/output data recorded.  General appearance: alert and cooperative Lungs: clear to auscultation bilaterally Wound: intact  Lab Results: Recent Labs    05/19/20 0026 05/20/20 0107  WBC 15.0* 12.4*  HGB 11.8* 11.7*  HCT 34.6* 35.4*  PLT 268 232   BMET:  Recent Labs    05/19/20 0026 05/20/20 0107  NA 136 141  K 4.1 4.3  CL 102 106  CO2 24 26  GLUCOSE 147* 118*  BUN 14 12  CREATININE 1.17 1.06  CALCIUM 8.7* 8.6*    PT/INR: No results for input(s): LABPROT, INR in the last 72 hours. ABG    Component Value Date/Time   PHART 7.436 05/16/2020 0845   HCO3 24.2 05/16/2020 0845   O2SAT 98.2 05/16/2020 0845   CBG (last 3)  No results for input(s): GLUCAP in the last 72 hours.  Assessment/Plan: S/P Procedure(s) (LRB): XI ROBOTIC ASSISTED THORASCOPY (Right) BULLECTOMY RESECTION OF APICAL BLEBS (Right) INTERCOSTAL NERVE BLOCK (Right) remove chest tube today  Possible d/c later today or in am   LOS: 2 days    Wonda Olds 05/20/2020

## 2020-05-20 NOTE — Plan of Care (Signed)
  Problem: Education: Goal: Knowledge of disease or condition will improve 05/20/2020 2003 by Theotis Barrio, RN Outcome: Progressing 05/20/2020 2003 by Theotis Barrio, RN Outcome: Progressing Goal: Knowledge of the prescribed therapeutic regimen will improve 05/20/2020 2003 by Theotis Barrio, RN Outcome: Progressing 05/20/2020 2003 by Theotis Barrio, RN Outcome: Progressing   Problem: Activity: Goal: Risk for activity intolerance will decrease 05/20/2020 2003 by Theotis Barrio, RN Outcome: Progressing 05/20/2020 2003 by Theotis Barrio, RN Outcome: Progressing   Problem: Cardiac: Goal: Will achieve and/or maintain hemodynamic stability 05/20/2020 2003 by Theotis Barrio, RN Outcome: Progressing 05/20/2020 2003 by Theotis Barrio, RN Outcome: Progressing   Problem: Clinical Measurements: Goal: Postoperative complications will be avoided or minimized 05/20/2020 2003 by Theotis Barrio, RN Outcome: Progressing 05/20/2020 2003 by Theotis Barrio, RN Outcome: Progressing   Problem: Respiratory: Goal: Respiratory status will improve 05/20/2020 2003 by Theotis Barrio, RN Outcome: Progressing 05/20/2020 2003 by Theotis Barrio, RN Outcome: Progressing   Problem: Pain Management: Goal: Pain level will decrease 05/20/2020 2003 by Theotis Barrio, RN Outcome: Progressing 05/20/2020 2003 by Theotis Barrio, RN Outcome: Progressing   Problem: Skin Integrity: Goal: Wound healing without signs and symptoms infection will improve 05/20/2020 2003 by Theotis Barrio, RN Outcome: Progressing 05/20/2020 2003 by Theotis Barrio, RN Outcome: Progressing

## 2020-05-20 NOTE — Plan of Care (Signed)
°  Problem: Education: °Goal: Knowledge of disease or condition will improve °Outcome: Progressing °Goal: Knowledge of the prescribed therapeutic regimen will improve °Outcome: Progressing °  °Problem: Activity: °Goal: Risk for activity intolerance will decrease °Outcome: Progressing °  °Problem: Cardiac: °Goal: Will achieve and/or maintain hemodynamic stability °Outcome: Progressing °  °Problem: Clinical Measurements: °Goal: Postoperative complications will be avoided or minimized °Outcome: Progressing °  °Problem: Respiratory: °Goal: Respiratory status will improve °Outcome: Progressing °  °Problem: Pain Management: °Goal: Pain level will decrease °Outcome: Progressing °  °Problem: Skin Integrity: °Goal: Wound healing without signs and symptoms infection will improve °Outcome: Progressing °  °

## 2020-05-21 ENCOUNTER — Inpatient Hospital Stay (HOSPITAL_COMMUNITY): Payer: 59

## 2020-05-21 LAB — BASIC METABOLIC PANEL
Anion gap: 8 (ref 5–15)
BUN: 11 mg/dL (ref 6–20)
CO2: 27 mmol/L (ref 22–32)
Calcium: 8.9 mg/dL (ref 8.9–10.3)
Chloride: 104 mmol/L (ref 98–111)
Creatinine, Ser: 0.94 mg/dL (ref 0.61–1.24)
GFR, Estimated: >60 mL/min (ref >60)
Glucose, Bld: 141 mg/dL — ABNORMAL HIGH (ref 70–99)
Potassium: 3.8 mmol/L (ref 3.5–5.1)
Sodium: 139 mmol/L (ref 135–145)

## 2020-05-21 MED ORDER — OXYCODONE-ACETAMINOPHEN 5-325 MG PO TABS: 1.0000 | ORAL_TABLET | ORAL | 0 refills | Status: AC | PRN

## 2020-05-21 NOTE — Progress Notes (Signed)
TCTS Rounds  CXR stable this am Ambulating in hallway without difficulty  Should be able to discharge today. Page Pucciarelli Z. Orvan Seen, [place]

## 2020-05-21 NOTE — Plan of Care (Signed)
Problem: Education: Goal: Knowledge of disease or condition will improve Outcome: Completed/Met Goal: Knowledge of the prescribed therapeutic regimen will improve Outcome: Completed/Met   Problem: Activity: Goal: Risk for activity intolerance will decrease Outcome: Completed/Met   Problem: Cardiac: Goal: Will achieve and/or maintain hemodynamic stability Outcome: Completed/Met   Problem: Clinical Measurements: Goal: Postoperative complications will be avoided or minimized Outcome: Completed/Met   Problem: Respiratory: Goal: Respiratory status will improve Outcome: Completed/Met   Problem: Pain Management: Goal: Pain level will decrease Outcome: Completed/Met   Problem: Skin Integrity: Goal: Wound healing without signs and symptoms infection will improve Outcome: Completed/Met   

## 2020-05-21 NOTE — Discharge Instructions (Signed)

## 2020-05-23 ENCOUNTER — Telehealth: Payer: Self-pay

## 2020-05-23 NOTE — Telephone Encounter (Signed)
Approximate return to work date will be 08/14/2020/ 12 week post op recovery period

## 2020-05-23 NOTE — Telephone Encounter (Signed)
FMLA form completed and faxed to [employer] @ [phone], beginning leave 05/18/20, approx return to work date 07/14/2021

## 2020-05-26 ENCOUNTER — Telehealth: Payer: Self-pay

## 2020-05-26 NOTE — Telephone Encounter (Signed)
STD forms completed for [employer] and faxed to [phone] Beginning leave 05/18/20, ending leave 08/14/20/ original forms mailed to home address

## 2020-06-02 ENCOUNTER — Other Ambulatory Visit: Payer: Self-pay | Admitting: Thoracic Surgery (Cardiothoracic Vascular Surgery)

## 2020-06-02 ENCOUNTER — Telehealth: Payer: Self-pay

## 2020-06-02 MED ORDER — OXYCODONE HCL 5 MG PO TABS: 5.0000 mg | ORAL_TABLET | Freq: Four times a day (QID) | ORAL | 0 refills | Status: DC | PRN

## 2020-06-02 NOTE — Progress Notes (Signed)
Aaron Griffin called requesting refill of pain medication.  Last refill was 05/21/2020, oxycodone/acetaminophen 5/325, 20 tablets, no refills.  Prescription sent for oxycodone 5 mg tablets 1 p.o. every 6 hours as needed, 20 tablets, no refills.  May use Tylenol or ibuprofen in addition.

## 2020-06-02 NOTE — Telephone Encounter (Signed)
-----   Message from Melrose Nakayama, MD sent at 06/02/2020  2:52 PM EDT ----- Regarding: RE: refill on pain medication Script sent ----- Message ----- From: Marylen Ponto, LPN Sent: 72/02/2181   9:51 AM EDT To: Melrose Nakayama, MD Subject: refill on pain medication                      Pt requesting refill on oxyCODONE-acetaminophen 5-325 MG tablet,  Pharm is [facility] in [place], in [place]. Please advise Linden Dolin

## 2020-06-05 ENCOUNTER — Other Ambulatory Visit: Payer: Self-pay | Admitting: Thoracic Surgery (Cardiothoracic Vascular Surgery)

## 2020-06-05 DIAGNOSIS — J439 Emphysema, unspecified: Secondary | ICD-10-CM

## 2020-06-06 ENCOUNTER — Ambulatory Visit
Admission: RE | Admit: 2020-06-06 | Discharge: 2020-06-06 | Disposition: A | Discharge status: Home / Self Care | Payer: 59 | Source: Ambulatory Visit | Attending: Thoracic Surgery (Cardiothoracic Vascular Surgery) | Admitting: Thoracic Surgery (Cardiothoracic Vascular Surgery)

## 2020-06-06 ENCOUNTER — Ambulatory Visit (INDEPENDENT_AMBULATORY_CARE_PROVIDER_SITE_OTHER): Payer: Self-pay | Admitting: Thoracic Surgery (Cardiothoracic Vascular Surgery)

## 2020-06-06 ENCOUNTER — Other Ambulatory Visit: Payer: Self-pay

## 2020-06-06 VITALS — BP 98/71 | HR 67 | Temp 97.9°F | Resp 20 | Ht 69.0 in | Wt 209.0 lb

## 2020-06-06 DIAGNOSIS — Z09 Encounter for follow-up examination after completed treatment for conditions other than malignant neoplasm: Secondary | ICD-10-CM

## 2020-06-06 DIAGNOSIS — J439 Emphysema, unspecified: Secondary | ICD-10-CM

## 2020-06-06 DIAGNOSIS — Z736 Limitation of activities due to disability: Secondary | ICD-10-CM

## 2020-06-06 NOTE — Progress Notes (Signed)
[address]       [place] [address]             [phone]     HPI: Aaron Griffin returns for a scheduled follow-up visit  Aaron Griffin is a 54 year old man with a history of tobacco abuse, sleep apnea, aortic atherosclerosis, and giant bulla of the right lung.  He recently had an infection of the right lung bulla.  That was treated with antibiotics.  He underwent a robotic resection of the bulla as well as some apical blebs on 05/18/2020.  Postoperatively he did well and went home on day 3.  He feels well.  He does get tired with exertion.  He is walking about 1/4 mile at a time.  He also notices some soreness and a sensation of something moving when the finishes his walks.  He has been taking oxycodone about every 6 hours for the pain.  He has also been using some Tylenol, but not on a regular basis.  Past Medical History:  Diagnosis Date  . Bulla of lung ([place])   . Emphysema lung ([place])   . Sleep apnea    cpap    Current Outpatient Medications  Medication Sig Dispense Refill  . diphenhydrAMINE HCl (ZZZQUIL) 50 MG/30ML LIQD Take 50 mg by mouth at bedtime.     Marland Kitchen oxyCODONE (OXY IR/ROXICODONE) 5 MG immediate release tablet Take 1 tablet (5 mg total) by mouth every 6 (six) hours as needed for severe pain. 30 tablet 0  . rosuvastatin (CRESTOR) 10 MG tablet Take 1 tablet (10 mg total) by mouth daily. 30 tablet 4   No current facility-administered medications for this visit.    Physical Exam BP 98/71   Pulse 67   Temp 97.9 F (36.6 C) (Skin)   Resp 20   Ht 5\' 9"  (1.753 m)   Wt 209 lb (94.8 kg)   SpO2 98% Comment: RA  BMI 30.86 kg/m  54 year old man in no acute distress Alert and oriented x3 with no focal deficits Lungs slightly diminished on right, otherwise clear Incisions healing well No peripheral edema  Diagnostic Tests: CHEST - 2 VIEW  COMPARISON:  05/21/2020  FINDINGS: Normal heart size, mediastinal contours, and pulmonary  vascularity.  Central peribronchial thickening with ovoid collection at minor fissure likely loculated fluid/pseudotumor.  Loculated hydropneumothorax at RIGHT apex.  Remaining lungs clear.  Osseous structures unremarkable.  IMPRESSION: Loculated hydropneumothorax at RIGHT apex, with slightly increased fluid component and less pneumothorax component than on previous exam.   Electronically Signed   By: Lavonia Dana M.D.   On: 06/06/2020 10:32 I personally reviewed the chest x-ray images and concur with the findings noted above  Impression: Aaron Griffin is a 54 year old man who had a giant bulla of the right lung as well as right apical blebs.  He recently had an infection involving the bulla.  He underwent a robotic resection of these areas on 05/18/2020.  He is now about 2-1/2 weeks out from surgery.  Overall he is doing well.  He does still have some incisional pain.  He is taking narcotics about 4 times a day but is only taking 5 mg of oxycodone.  Advised him to use either Tylenol or Aleve on a regular basis and then only take the oxycodone as needed.  He is not abusing it in any way, but we would like to work him off of that over the next month or so.  He should  wait until he is no longer having to take the oxycodone during the day before he starts driving.  I suspect that will be in the next couple of weeks.  I will plan on seeing him back in about a month and we can talk about going back to work at that point.  Plan: Return in 4 weeks with PA lateral chest x-ray  Melrose Nakayama, MD [facility] [phone]

## 2020-06-12 ENCOUNTER — Telehealth: Payer: Self-pay

## 2020-06-12 NOTE — Telephone Encounter (Signed)
Patient contacted the office requesting a refill of his pain medication.  He is s/p RATS with Dr. Roxan Hockey 05/18/20.  At his last visit Dr. Roxan Hockey requested he decrease his pain medication and was taking about 4 a day.  He stated that he is currently taking about 3 a day, but felt that he may be able to decrease to 2.  He stated that he currently has 1 left.  Advised patient that he may not get another refill as this would be his second.  But did say that he could try to alternate Tylenol and Ibuprofen/ Aleve, every 4 hours during the day and see if that helps.  He stated that he was not taking the tylenol/ ibuprofen regularly.  He acknowledged receipt.

## 2020-06-28 ENCOUNTER — Other Ambulatory Visit: Payer: Self-pay | Admitting: Thoracic Surgery (Cardiothoracic Vascular Surgery)

## 2020-06-28 DIAGNOSIS — J439 Emphysema, unspecified: Secondary | ICD-10-CM

## 2020-07-03 ENCOUNTER — Telehealth: Payer: Self-pay

## 2020-07-03 DIAGNOSIS — Z79899 Other long term (current) drug therapy: Secondary | ICD-10-CM

## 2020-07-03 DIAGNOSIS — E781 Pure hyperglyceridemia: Secondary | ICD-10-CM

## 2020-07-03 NOTE — Telephone Encounter (Signed)
Pt made appt for blood work check up and Med Check on 12/09 needs blood work ordered and also needs refill until appt on rosuvastatin (CRESTOR) 10 MG tablet sent to [facility] [phone] - [place], [address] AT [address]   Pt call back [phone]

## 2020-07-04 ENCOUNTER — Ambulatory Visit: Payer: 59 | Admitting: Thoracic Surgery (Cardiothoracic Vascular Surgery)

## 2020-07-04 ENCOUNTER — Ambulatory Visit (INDEPENDENT_AMBULATORY_CARE_PROVIDER_SITE_OTHER): Payer: Self-pay | Admitting: Thoracic Surgery (Cardiothoracic Vascular Surgery)

## 2020-07-04 ENCOUNTER — Ambulatory Visit
Admission: RE | Admit: 2020-07-04 | Discharge: 2020-07-04 | Disposition: A | Discharge status: Home / Self Care | Payer: 59 | Source: Ambulatory Visit | Attending: Thoracic Surgery (Cardiothoracic Vascular Surgery) | Admitting: Thoracic Surgery (Cardiothoracic Vascular Surgery)

## 2020-07-04 ENCOUNTER — Other Ambulatory Visit: Payer: Self-pay

## 2020-07-04 ENCOUNTER — Encounter: Payer: Self-pay | Admitting: Thoracic Surgery (Cardiothoracic Vascular Surgery)

## 2020-07-04 VITALS — BP 115/81 | HR 75 | Resp 18 | Ht 69.0 in | Wt 217.0 lb

## 2020-07-04 DIAGNOSIS — J439 Emphysema, unspecified: Secondary | ICD-10-CM

## 2020-07-04 DIAGNOSIS — Z09 Encounter for follow-up examination after completed treatment for conditions other than malignant neoplasm: Secondary | ICD-10-CM

## 2020-07-04 MED ORDER — GABAPENTIN 300 MG PO CAPS: ORAL_CAPSULE | ORAL | 2 refills | Status: DC

## 2020-07-04 NOTE — Telephone Encounter (Signed)
May have 90 days, lipid liver, metabolic 7, CBC, anemia, hypocalcemia, hyperlipidemia

## 2020-07-04 NOTE — Progress Notes (Signed)
[address]       [place] [address]             [phone]     HPI: [name] returns for scheduled follow-up visit  Aaron Griffin is a 54 year old man with a history of tobacco abuse (quit 2016), sleep apnea, aortic atherosclerosis, and a giant bulla of the right lung.  He had an infection involving the bulla this summer.  I was treated with antibiotics.  We then did a robotic resection of the bulla as well as some apical blebs on 05/18/2020.  He did well initially postoperatively and went home on day 3.  I saw him back in the office in late October.  He was having some pain and was using oxycodone about every 6 hours.  He is no longer using oxycodone.  He does complain of numbness in the right lower rib cage and right upper quadrant, muscle laxity in the right upper quadrant and pain in that area when he exerts himself.  When the pain occurs it has a pins-and-needles type quality and can last a couple of days.  Past Medical History:  Diagnosis Date  . Bulla of lung ([place])   . Emphysema lung ([place])   . Sleep apnea    cpap   Current Outpatient Medications  Medication Sig Dispense Refill  . diphenhydrAMINE HCl (ZZZQUIL) 50 MG/30ML LIQD Take 50 mg by mouth at bedtime.     . rosuvastatin (CRESTOR) 10 MG tablet Take 1 tablet (10 mg total) by mouth daily. 30 tablet 4  . gabapentin (NEURONTIN) 300 MG capsule Take 1 capsule (300 mg total) by mouth daily for 3 days, THEN 1 capsule (300 mg total) 2 (two) times daily for 3 days, THEN 1 capsule (300 mg total) 3 (three) times daily for 24 days. 90 capsule 2   No current facility-administered medications for this visit.    Physical Exam BP 115/81 (BP Location: Right Arm, Patient Position: Sitting)   Pulse 75   Resp 18   Ht 5\' 9"  (1.753 m)   Wt 217 lb (98.4 kg)   SpO2 96% Comment: RA with mask on  BMI 32.36 kg/m  54 year old man in no acute distress Alert and oriented x3 with no focal deficits Lungs clear  bilaterally Incisions well-healed Muscle atrophy right upper quadrant  Diagnostic Tests: CHEST - 2 VIEW  COMPARISON:  06/06/2020.  FINDINGS: Decreased size of a right apical hydropneumothorax with decreased air component. Similar ovoid collection at the minor fissure, likely loculated fluid/pseudotumor. Remaining lungs are clear. Cardiomediastinal silhouette is within normal limits. No acute osseous abnormality.  IMPRESSION: Decreased size of a right apical hydropneumothorax with decreased air component.   Electronically Signed   By: Margaretha Sheffield MD   On: 07/04/2020 12:01 I personally reviewed the chest x-ray images and concur with the findings noted above, evolution of postoperative changes.  Impression: Aaron Griffin is a 54 year old man with a history of tobacco abuse (quit 2016), sleep apnea, aortic atherosclerosis, and a giant bulla of the right lung.  He had a robotic resection of the bulla on 05/18/2020.  His initial postoperative course was uncomplicated but he was actually having more pain when he came back to the office about 3 weeks later.  He now is off narcotics but continues to have neuropathic type pain in the right lower rib cage and right upper quadrant region.  I think he would benefit from gabapentin.  We will start with  300 mg daily for 3 days then go to twice daily for 3 days and then to 3 times daily.  We will plan to keep him on that for about 3 months pending his response.  He feels like he can return to work yet due to the pain.  Hopefully gabapentin will help with that.  If it does he may return to work whenever he feels like he can do what is necessary.  Plan: Gabapentin Return in 3 months with PA lateral chest x-ray  Melrose Nakayama, MD [facility] [phone]

## 2020-07-05 MED ORDER — ROSUVASTATIN CALCIUM 10 MG PO TABS
10.0000 mg | ORAL_TABLET | Freq: Every day | ORAL | 0 refills | Status: DC
Start: 2020-07-05 — End: 2020-08-29

## 2020-07-14 LAB — CBC WITH DIFFERENTIAL/PLATELET
Basophils Absolute: 0.1 10*3/uL (ref 0.0–0.2)
Basos: 2 %
EOS (ABSOLUTE): 0.3 10*3/uL (ref 0.0–0.4)
Eos: 3 %
Hematocrit: 41.1 % (ref 37.5–51.0)
Hemoglobin: 13.7 g/dL (ref 13.0–17.7)
Immature Grans (Abs): 0 10*3/uL (ref 0.0–0.1)
Immature Granulocytes: 0 %
Lymphocytes Absolute: 3.6 10*3/uL — ABNORMAL HIGH (ref 0.7–3.1)
Lymphs: 38 %
MCH: 30.5 pg (ref 26.6–33.0)
MCHC: 33.3 g/dL (ref 31.5–35.7)
MCV: 92 fL (ref 79–97)
Monocytes Absolute: 1.1 10*3/uL — ABNORMAL HIGH (ref 0.1–0.9)
Monocytes: 11 %
Neutrophils Absolute: 4.3 10*3/uL (ref 1.4–7.0)
Neutrophils: 46 %
Platelets: 290 10*3/uL (ref 150–450)
RBC: 4.49 x10E6/uL (ref 4.14–5.80)
RDW: 12.9 % (ref 11.6–15.4)
WBC: 9.5 10*3/uL (ref 3.4–10.8)

## 2020-07-14 LAB — LIPID PANEL
Chol/HDL Ratio: 4.8 ratio (ref 0.0–5.0)
Cholesterol, Total: 212 mg/dL — ABNORMAL HIGH (ref 100–199)
HDL: 44 mg/dL (ref >39)
LDL Chol Calc (NIH): 122 mg/dL — ABNORMAL HIGH (ref 0–99)
Triglycerides: 259 mg/dL — ABNORMAL HIGH (ref 0–149)
VLDL Cholesterol Cal: 46 mg/dL — ABNORMAL HIGH (ref 5–40)

## 2020-07-14 LAB — BASIC METABOLIC PANEL
BUN/Creatinine Ratio: 11 (ref 9–20)
BUN: 11 mg/dL (ref 6–24)
CO2: 21 mmol/L (ref 20–29)
Calcium: 9.9 mg/dL (ref 8.7–10.2)
Chloride: 101 mmol/L (ref 96–106)
Creatinine, Ser: 1.04 mg/dL (ref 0.76–1.27)
GFR calc Af Amer: 95 mL/min/{1.73_m2} (ref >59)
GFR calc non Af Amer: 82 mL/min/{1.73_m2} (ref >59)
Glucose: 139 mg/dL — ABNORMAL HIGH (ref 65–99)
Potassium: 4.2 mmol/L (ref 3.5–5.2)
Sodium: 139 mmol/L (ref 134–144)

## 2020-07-14 LAB — HEPATIC FUNCTION PANEL
ALT: 52 IU/L — ABNORMAL HIGH (ref 0–44)
AST: 29 IU/L (ref 0–40)
Albumin: 4.7 g/dL (ref 3.8–4.9)
Alkaline Phosphatase: 70 IU/L (ref 44–121)
Bilirubin Total: 0.3 mg/dL (ref 0.0–1.2)
Bilirubin, Direct: <0.1 mg/dL (ref 0.00–0.40)
Total Protein: 7.5 g/dL (ref 6.0–8.5)

## 2020-07-20 ENCOUNTER — Encounter: Payer: Self-pay | Admitting: Family Medicine

## 2020-07-20 ENCOUNTER — Ambulatory Visit (INDEPENDENT_AMBULATORY_CARE_PROVIDER_SITE_OTHER): Payer: 59 | Admitting: Family Medicine

## 2020-07-20 ENCOUNTER — Other Ambulatory Visit: Payer: Self-pay

## 2020-07-20 VITALS — BP 118/72 | HR 97 | Temp 97.6°F | Wt 224.0 lb

## 2020-07-20 DIAGNOSIS — R7301 Impaired fasting glucose: Secondary | ICD-10-CM

## 2020-07-20 DIAGNOSIS — R7401 Elevation of levels of liver transaminase levels: Secondary | ICD-10-CM

## 2020-07-20 DIAGNOSIS — E781 Pure hyperglyceridemia: Secondary | ICD-10-CM | POA: Diagnosis not present

## 2020-07-20 NOTE — Patient Instructions (Signed)
Diabetes Mellitus and Nutrition, Adult When you have diabetes (diabetes mellitus), it is very important to have healthy eating habits because your blood sugar (glucose) levels are greatly affected by what you eat and drink. Eating healthy foods in the appropriate amounts, at about the same times every day, can help you:  Control your blood glucose.  Lower your risk of heart disease.  Improve your blood pressure.  Reach or maintain a healthy weight. Every person with diabetes is different, and each person has different needs for a meal plan. Your health care provider may recommend that you work with a diet and nutrition specialist (dietitian) to make a meal plan that is best for you. Your meal plan may vary depending on factors such as:  The calories you need.  The medicines you take.  Your weight.  Your blood glucose, blood pressure, and cholesterol levels.  Your activity level.  Other health conditions you have, such as heart or kidney disease. How do carbohydrates affect me? Carbohydrates, also called carbs, affect your blood glucose level more than any other type of food. Eating carbs naturally raises the amount of glucose in your blood. Carb counting is a method for keeping track of how many carbs you eat. Counting carbs is important to keep your blood glucose at a healthy level, especially if you use insulin or take certain oral diabetes medicines. It is important to know how many carbs you can safely have in each meal. This is different for every person. Your dietitian can help you calculate how many carbs you should have at each meal and for each snack. Foods that contain carbs include:  Bread, cereal, rice, pasta, and crackers.  Potatoes and corn.  Peas, beans, and lentils.  Milk and yogurt. Fruit and juice.  Desserts, such as cakes, cookies, ice cream, and candy. How does alcohol affect me? Alcohol can cause a sudden decrease in blood glucose (hypoglycemia), especially  if you use insulin or take certain oral diabetes medicines. Hypoglycemia can be a life-threatening condition. Symptoms of hypoglycemia (sleepiness, dizziness, and confusion) are similar to symptoms of having too much alcohol. If your health care provider says that alcohol is safe for you, follow these guidelines:  Limit alcohol intake to no more than 1 drink per day for nonpregnant women and 2 drinks per day for men. One drink equals 12 oz of beer, 5 oz of wine, or 1 oz of hard liquor.  Do not drink on an empty stomach.  Keep yourself hydrated with water, diet soda, or unsweetened iced tea.  Keep in mind that regular soda, juice, and other mixers may contain a lot of sugar and must be counted as carbs. What are tips for following this plan?  Reading food labels  Start by checking the serving size on the "Nutrition Facts" label of packaged foods and drinks. The amount of calories, carbs, fats, and other nutrients listed on the label is based on one serving of the item. Many items contain more than one serving per package.  Check the total grams (g) of carbs in one serving. You can calculate the number of servings of carbs in one serving by dividing the total carbs by 15. For example, if a food has 30 g of total carbs, it would be equal to 2 servings of carbs.  Check the number of grams (g) of saturated and trans fats in one serving. Choose foods that have low or no amount of these fats.  Check the number of milligrams (  mg) of salt (sodium) in one serving. Most people should limit total sodium intake to less than 2,300 mg per day.  Always check the nutrition information of foods labeled as "low-fat" or "nonfat". These foods may be higher in added sugar or refined carbs and should be avoided.  Talk to your dietitian to identify your daily goals for nutrients listed on the label. Shopping  Avoid buying canned, premade, or processed foods. These foods tend to be high in fat, sodium, and added  sugar.  Shop around the outside edge of the grocery store. This includes fresh fruits and vegetables, bulk grains, fresh meats, and fresh dairy. Cooking  Use low-heat cooking methods, such as baking, instead of high-heat cooking methods like deep frying.  Cook using healthy oils, such as olive, canola, or sunflower oil.  Avoid cooking with butter, cream, or high-fat meats. Meal planning  Eat meals and snacks regularly, preferably at the same times every day. Avoid going long periods of time without eating.  Eat foods high in fiber, such as fresh fruits, vegetables, beans, and whole grains. Talk to your dietitian about how many servings of carbs you can eat at each meal.  Eat 4-6 ounces (oz) of lean protein each day, such as lean meat, chicken, fish, eggs, or tofu. One oz of lean protein is equal to: ? 1 oz of meat, chicken, or fish. ? 1 egg. ?  cup of tofu.  Eat some foods each day that contain healthy fats, such as avocado, nuts, seeds, and fish. Lifestyle  Check your blood glucose regularly.  Exercise regularly as told by your health care provider. This may include: ? 150 minutes of moderate-intensity or vigorous-intensity exercise each week. This could be brisk walking, biking, or water aerobics. ? Stretching and doing strength exercises, such as yoga or weightlifting, at least 2 times a week.  Take medicines as told by your health care provider.  Do not use any products that contain nicotine or tobacco, such as cigarettes and e-cigarettes. If you need help quitting, ask your health care provider.  Work with a counselor or diabetes educator to identify strategies to manage stress and any emotional and social challenges. Questions to ask a health care provider  Do I need to meet with a diabetes educator?  Do I need to meet with a dietitian?  What number can I call if I have questions?  When are the best times to check my blood glucose? Where to find more  information:  American Diabetes Association: diabetes.org  Academy of Nutrition and Dietetics: www.eatright.org  National Institute of Diabetes and Digestive and Kidney Diseases (NIH): www.niddk.nih.gov Summary  A healthy meal plan will help you control your blood glucose and maintain a healthy lifestyle.  Working with a diet and nutrition specialist (dietitian) can help you make a meal plan that is best for you.  Keep in mind that carbohydrates (carbs) and alcohol have immediate effects on your blood glucose levels. It is important to count carbs and to use alcohol carefully. This information is not intended to replace advice given to you by your health care provider. Make sure you discuss any questions you have with your health care provider. Document Revised: 07/11/2017 Document Reviewed: 09/02/2016 Elsevier Patient Education  2020 Elsevier Inc.  DASH Eating Plan DASH stands for "Dietary Approaches to Stop Hypertension." The DASH eating plan is a healthy eating plan that has been shown to reduce high blood pressure (hypertension). It may also reduce your risk for type 2   diabetes, heart disease, and stroke. The DASH eating plan may also help with weight loss. What are tips for following this plan?  General guidelines  Avoid eating more than 2,300 mg (milligrams) of salt (sodium) a day. If you have hypertension, you may need to reduce your sodium intake to 1,500 mg a day.  Limit alcohol intake to no more than 1 drink a day for nonpregnant women and 2 drinks a day for men. One drink equals 12 oz of beer, 5 oz of wine, or 1 oz of hard liquor.  Work with your health care provider to maintain a healthy body weight or to lose weight. Ask what an ideal weight is for you.  Get at least 30 minutes of exercise that causes your heart to beat faster (aerobic exercise) most days of the week. Activities may include walking, swimming, or biking.  Work with your health care provider or diet and  nutrition specialist (dietitian) to adjust your eating plan to your individual calorie needs. Reading food labels   Check food labels for the amount of sodium per serving. Choose foods with less than 5 percent of the Daily Value of sodium. Generally, foods with less than 300 mg of sodium per serving fit into this eating plan.  To find whole grains, look for the word "whole" as the first word in the ingredient list. Shopping  Buy products labeled as "low-sodium" or "no salt added."  Buy fresh foods. Avoid canned foods and premade or frozen meals. Cooking  Avoid adding salt when cooking. Use salt-free seasonings or herbs instead of table salt or sea salt. Check with your health care provider or pharmacist before using salt substitutes.  Do not fry foods. Cook foods using healthy methods such as baking, boiling, grilling, and broiling instead.  Cook with heart-healthy oils, such as olive, canola, soybean, or sunflower oil. Meal planning  Eat a balanced diet that includes: ? 5 or more servings of fruits and vegetables each day. At each meal, try to fill half of your plate with fruits and vegetables. ? Up to 6-8 servings of whole grains each day. ? Less than 6 oz of lean meat, poultry, or fish each day. A 3-oz serving of meat is about the same size as a deck of cards. One egg equals 1 oz. ? 2 servings of low-fat dairy each day. ? A serving of nuts, seeds, or beans 5 times each week. ? Heart-healthy fats. Healthy fats called Omega-3 fatty acids are found in foods such as flaxseeds and coldwater fish, like sardines, salmon, and mackerel.  Limit how much you eat of the following: ? Canned or prepackaged foods. ? Food that is high in trans fat, such as fried foods. ? Food that is high in saturated fat, such as fatty meat. ? Sweets, desserts, sugary drinks, and other foods with added sugar. ? Full-fat dairy products.  Do not salt foods before eating.  Try to eat at least 2 vegetarian  meals each week.  Eat more home-cooked food and less restaurant, buffet, and fast food.  When eating at a restaurant, ask that your food be prepared with less salt or no salt, if possible. What foods are recommended? The items listed may not be a complete list. Talk with your dietitian about what dietary choices are best for you. Grains Whole-grain or whole-wheat bread. Whole-grain or whole-wheat pasta. Brown rice. Oatmeal. Quinoa. Bulgur. Whole-grain and low-sodium cereals. Pita bread. Low-fat, low-sodium crackers. Whole-wheat flour tortillas. Vegetables Fresh or frozen vegetables (  raw, steamed, roasted, or grilled). Low-sodium or reduced-sodium tomato and vegetable juice. Low-sodium or reduced-sodium tomato sauce and tomato paste. Low-sodium or reduced-sodium canned vegetables. Fruits All fresh, dried, or frozen fruit. Canned fruit in natural juice (without added sugar). Meat and other protein foods Skinless chicken or turkey. Ground chicken or turkey. Pork with fat trimmed off. Fish and seafood. Egg whites. Dried beans, peas, or lentils. Unsalted nuts, nut butters, and seeds. Unsalted canned beans. Lean cuts of beef with fat trimmed off. Low-sodium, lean deli meat. Dairy Low-fat (1%) or fat-free (skim) milk. Fat-free, low-fat, or reduced-fat cheeses. Nonfat, low-sodium ricotta or cottage cheese. Low-fat or nonfat yogurt. Low-fat, low-sodium cheese. Fats and oils Soft margarine without trans fats. Vegetable oil. Low-fat, reduced-fat, or light mayonnaise and salad dressings (reduced-sodium). Canola, safflower, olive, soybean, and sunflower oils. Avocado. Seasoning and other foods Herbs. Spices. Seasoning mixes without salt. Unsalted popcorn and pretzels. Fat-free sweets. What foods are not recommended? The items listed may not be a complete list. Talk with your dietitian about what dietary choices are best for you. Grains Baked goods made with fat, such as croissants, muffins, or some  breads. Dry pasta or rice meal packs. Vegetables Creamed or fried vegetables. Vegetables in a cheese sauce. Regular canned vegetables (not low-sodium or reduced-sodium). Regular canned tomato sauce and paste (not low-sodium or reduced-sodium). Regular tomato and vegetable juice (not low-sodium or reduced-sodium). Pickles. Olives. Fruits Canned fruit in a light or heavy syrup. Fried fruit. Fruit in cream or butter sauce. Meat and other protein foods Fatty cuts of meat. Ribs. Fried meat. Bacon. Sausage. Bologna and other processed lunch meats. Salami. Fatback. Hotdogs. Bratwurst. Salted nuts and seeds. Canned beans with added salt. Canned or smoked fish. Whole eggs or egg yolks. Chicken or turkey with skin. Dairy Whole or 2% milk, cream, and half-and-half. Whole or full-fat cream cheese. Whole-fat or sweetened yogurt. Full-fat cheese. Nondairy creamers. Whipped toppings. Processed cheese and cheese spreads. Fats and oils Butter. Stick margarine. Lard. Shortening. Ghee. Bacon fat. Tropical oils, such as coconut, palm kernel, or palm oil. Seasoning and other foods Salted popcorn and pretzels. Onion salt, garlic salt, seasoned salt, table salt, and sea salt. Worcestershire sauce. Tartar sauce. Barbecue sauce. Teriyaki sauce. Soy sauce, including reduced-sodium. Steak sauce. Canned and packaged gravies. Fish sauce. Oyster sauce. Cocktail sauce. Horseradish that you find on the shelf. Ketchup. Mustard. Meat flavorings and tenderizers. Bouillon cubes. Hot sauce and Tabasco sauce. Premade or packaged marinades. Premade or packaged taco seasonings. Relishes. Regular salad dressings. Where to find more information:  National Heart, Lung, and Blood Institute: www.nhlbi.nih.gov  American Heart Association: www.heart.org Summary  The DASH eating plan is a healthy eating plan that has been shown to reduce high blood pressure (hypertension). It may also reduce your risk for type 2 diabetes, heart disease, and  stroke.  With the DASH eating plan, you should limit salt (sodium) intake to 2,300 mg a day. If you have hypertension, you may need to reduce your sodium intake to 1,500 mg a day.  When on the DASH eating plan, aim to eat more fresh fruits and vegetables, whole grains, lean proteins, low-fat dairy, and heart-healthy fats.  Work with your health care provider or diet and nutrition specialist (dietitian) to adjust your eating plan to your individual calorie needs. This information is not intended to replace advice given to you by your health care provider. Make sure you discuss any questions you have with your health care provider. Document Revised: 07/11/2017 Document Reviewed: 07/22/2016   Elsevier Patient Education  2020 Elsevier Inc.   

## 2020-07-20 NOTE — Progress Notes (Signed)
   Subjective:    Patient ID: Aaron Griffin, male    DOB: 06-04-1966, 54 y.o.   MRN: [id number]  HPI Pt here for med check and to go over recent blood work. Pt recently had surgery and was placed on Gabapentin TID.  Patient had part of his lung removed.  He has had ongoing pain and discomfort where the surgery was.  Has localized neuropathy issues.  Is gradually getting better. History of hyperlipidemia takes his medication regular basis.  Tries watch his diet to the most part.  Denies any trouble with the medicine. Having a difficult time getting back to his former level of health  Review of Systems  Constitutional: Negative for diaphoresis and fatigue.  HENT: Negative for congestion and rhinorrhea.   Respiratory: Negative for cough and shortness of breath.   Cardiovascular: Negative for chest pain and leg swelling.  Gastrointestinal: Negative for abdominal pain and diarrhea.  Skin: Negative for color change and rash.  Neurological: Negative for dizziness and headaches.  Psychiatric/Behavioral: Negative for behavioral problems and confusion.       Objective:   Physical Exam Vitals reviewed.  Constitutional:      General: He is not in acute distress.    Appearance: He is well-nourished.  HENT:     Head: Normocephalic and atraumatic.  Eyes:     General:        Right eye: No discharge.        Left eye: No discharge.  Neck:     Trachea: No tracheal deviation.  Cardiovascular:     Rate and Rhythm: Normal rate and regular rhythm.     Heart sounds: Normal heart sounds. No murmur heard.   Pulmonary:     Effort: Pulmonary effort is normal. No respiratory distress.     Breath sounds: Normal breath sounds.  Musculoskeletal:        General: No edema.  Lymphadenopathy:     Cervical: No cervical adenopathy.  Skin:    General: Skin is warm and dry.  Neurological:     Mental Status: He is alert.     Coordination: Coordination normal.  Psychiatric:        Mood and Affect: Mood  and affect normal.        Behavior: Behavior normal.           Assessment & Plan:  1. Hypertriglyceridemia Continue Crestor watch diet closely stay physically active try to lose weight repeat labs again in 3 months  2. Fasting hyperglycemia Concerning for the possibility of developing diabetes dietary measures discussed portion control X exercise losing weight and rechecking this again in 3 months  3. Elevated transaminase level Liver enzymes in 3 months more than likely this is fatty liver if it persists despite his efforts at weight loss and watching diet then we will need to do additional lab work and ultrasound

## 2020-07-21 NOTE — Progress Notes (Signed)
07/21/20 Lab orders put in reminder file

## 2020-08-29 ENCOUNTER — Other Ambulatory Visit: Payer: Self-pay | Admitting: Family Medicine

## 2020-09-05 ENCOUNTER — Telehealth: Payer: Self-pay

## 2020-09-05 MED ORDER — GABAPENTIN 600 MG PO TABS: 600.0000 mg | ORAL_TABLET | Freq: Three times a day (TID) | ORAL | 1 refills | Status: DC

## 2020-09-05 NOTE — Telephone Encounter (Signed)
Patient contacted and made aware of dose change.  Prescription sent into patient's preferred pharmacy, [facility], [address] [place], [place].  Patient acknowledged receipt.

## 2020-09-05 NOTE — Telephone Encounter (Signed)
-----   Message from Melrose Nakayama, MD sent at 09/05/2020  7:27 AM EST ----- Regarding: RE: Medication not working well Increase his dose to 600 mg TID  [facility]  ----- Message ----- From: Donnella Sham, RN Sent: 09/04/2020   4:54 PM EST To: Melrose Nakayama, MD Subject: Medication not working well                    Hey,  He called and states that the Gabapentin isn't working as well now as it was in the beginning.  He says that it is a constant pain, but really feels like "needles" when he starts to work and do a lot of movement.  Please advise.  Thanks,  Caryl Pina

## 2020-10-02 ENCOUNTER — Other Ambulatory Visit: Payer: Self-pay | Admitting: Thoracic Surgery (Cardiothoracic Vascular Surgery)

## 2020-10-02 DIAGNOSIS — J439 Emphysema, unspecified: Secondary | ICD-10-CM

## 2020-10-03 ENCOUNTER — Ambulatory Visit
Admission: RE | Admit: 2020-10-03 | Discharge: 2020-10-03 | Disposition: A | Discharge status: Home / Self Care | Payer: 59 | Source: Ambulatory Visit | Attending: Thoracic Surgery (Cardiothoracic Vascular Surgery) | Admitting: Thoracic Surgery (Cardiothoracic Vascular Surgery)

## 2020-10-03 ENCOUNTER — Encounter: Payer: Self-pay | Admitting: Thoracic Surgery (Cardiothoracic Vascular Surgery)

## 2020-10-03 ENCOUNTER — Ambulatory Visit: Payer: 59 | Admitting: Thoracic Surgery (Cardiothoracic Vascular Surgery)

## 2020-10-03 ENCOUNTER — Other Ambulatory Visit: Payer: Self-pay

## 2020-10-03 DIAGNOSIS — Z9889 Other specified postprocedural states: Secondary | ICD-10-CM | POA: Insufficient documentation

## 2020-10-03 DIAGNOSIS — J439 Emphysema, unspecified: Secondary | ICD-10-CM

## 2020-10-03 NOTE — Progress Notes (Signed)
[address]       [place] [address]             [phone]    HPI: Aaron Griffin returns for a scheduled follow-up visit  Aaron Griffin is a 55 year old man with a history of tobacco abuse, sleep apnea, aortic atherosclerosis, and a giant bulla of the right lung.  The bulla got infected last summer.  He was treated with antibiotics and then underwent a robotic resection of the bulla and apical blebs on 05/18/2020.  I last saw him in November 2021.  He was having a lot of pain in the right costal margin region consistent with neuropathic pain.  He was started on gabapentin 300 mg 3 times a day.  That helped initially but his pain started getting worse again in late January he was increased to 600 mg 3 times a day.  Since his gabapentin dose was increased he has had improvement of the pain again.  He does have some soreness and a little tenderness in the right costal margin region.  There is still some laxity of the right upper quadrant musculature.  He says his pain is generally around 1 out of 10 at baseline.  It does sometimes are worse when he lifts or moves things.  Past Medical History:  Diagnosis Date  . Bulla of lung ([place])   . Emphysema lung ([place])   . Sleep apnea    cpap    Current Outpatient Medications  Medication Sig Dispense Refill  . diphenhydrAMINE HCl 50 MG/30ML LIQD Take 50 mg by mouth at bedtime.     . gabapentin (NEURONTIN) 600 MG tablet Take 1 tablet (600 mg total) by mouth 3 (three) times daily. 90 tablet 1  . rosuvastatin (CRESTOR) 10 MG tablet TAKE 1 TABLET(10 MG) BY MOUTH DAILY 30 tablet 0   No current facility-administered medications for this visit.    Physical Exam BP 113/80 (BP Location: Right Arm, Patient Position: Sitting, Cuff Size: Large)   Pulse 77   Temp 97.9 F (36.6 C) (Skin)   Resp 20   Ht 5\' 9"  (1.753 m)   Wt 209 lb (94.8 kg)   SpO2 95% Comment: RA  BMI 30.61 kg/m  55 year old man in no acute distress Alert and  oriented x3 with no focal deficits Incisions well-healed Tenderness along the right costal margin Lungs clear with equal breath sounds bilaterally  Diagnostic Tests: I personally reviewed the chest x-ray images.  There are postoperative changes from his bullectomy and blebectomy.  No rib fractures.  Impression: Aaron Griffin is a 55 year old man with a history of tobacco abuse, sleep apnea, aortic atherosclerosis, and a giant bulla of the right lung.  He had resection of the giant bulla and some apical blebs on 05/18/2020.  He did well initially postoperatively but then developed some neuropathic pain.  He was initially treated with gabapentin 300 mg 3 times daily.  That helped initially but his pain worsened again his dose was increased to 600 mg 3 times daily about a month ago.  Since then his pain has improved but has not completely resolved.  He generally does not take anything other than the gabapentin.  He is not using any narcotics.  Hopefully this pain will eventually resolve and he will be able to wean off the gabapentin.  He was cautioned not to stop the gabapentin suddenly  He is back at work and can do most activities.  He also is  working out at [employer].  Plan: Return in 3 months to check on progress  I spent over 20 minutes in review of records, images, and in consultation with Aaron Griffin today. Melrose Nakayama, MD [facility] [phone]

## 2020-10-04 ENCOUNTER — Telehealth: Payer: Self-pay

## 2020-10-04 NOTE — Telephone Encounter (Signed)
Pt had Med Check appt tomorrow with Santiago Glad no active labs on file is there blood work that needed to be done?   Pt call back [phone]

## 2020-10-04 NOTE — Telephone Encounter (Signed)
Last labs 07/2020: Lipid, Liver, Met 7, CBC

## 2020-10-05 ENCOUNTER — Ambulatory Visit: Payer: 59 | Admitting: Family Medicine

## 2020-10-05 ENCOUNTER — Encounter: Payer: Self-pay | Admitting: Family Medicine

## 2020-10-05 ENCOUNTER — Other Ambulatory Visit: Payer: Self-pay

## 2020-10-05 VITALS — BP 124/82 | HR 101 | Temp 96.7°F | Wt 216.4 lb

## 2020-10-05 DIAGNOSIS — R739 Hyperglycemia, unspecified: Secondary | ICD-10-CM

## 2020-10-05 DIAGNOSIS — E781 Pure hyperglyceridemia: Secondary | ICD-10-CM

## 2020-10-05 DIAGNOSIS — E785 Hyperlipidemia, unspecified: Secondary | ICD-10-CM | POA: Diagnosis not present

## 2020-10-05 MED ORDER — ROSUVASTATIN CALCIUM 10 MG PO TABS: ORAL_TABLET | ORAL | 2 refills | Status: DC

## 2020-10-05 NOTE — Patient Instructions (Signed)
Triglycerides Test Why am I having this test? Triglycerides are a type of fat in the body. Having a high level of triglycerides can increase your risk for heart disease. You may have this test as part of a routine physical exam. Health care providers recommend that adults have this test at least once every 5 years. If you have risk factors for heart disease or are being treated for high triglycerides, you may need to have this test more often. What is being tested? This test measures the amount of triglycerides in your blood. Triglycerides are naturally present in the body, and you also take in triglycerides by eating certain foods. Triglycerides may be measured as part of a test called a lipid profile, which tests triglycerides and cholesterol levels. What kind of sample is taken? A blood sample is required for this test. It may be collected by inserting a needle into a blood vessel, or by pricking a fingertip with a small needle (finger stick).   How do I prepare for this test?  Follow instructions from your health care provider about changing or stopping your regular medicines.  Do not eat or drink anything except water starting 9-12 hours before your test, or as long as told by your health care provider.  Do not drink alcohol starting at least 24 hours before your test.  Follow any instructions from your health care provider about dietary restrictions before your test. Tell a health care provider about:  All medicines you are taking, including vitamins, herbs, eye drops, creams, and over-the-counter medicines.  Any blood disorders you have.  Any medical conditions you have. How are the results reported? Your test results will be reported as a value that indicates how many triglycerides are in your blood. This will be given as milligrams of triglycerides per deciliter of blood (mg/dL). Your health care provider will compare your results to normal values that were established after  testing a large group of people (reference ranges). Reference ranges may vary among labs and hospitals. For this test, common reference ranges are:  Adults: ? Male: 40-160 mg/dL or 0.45-1.81 mmol/L (SI units). ? Male: 35-135 mg/dL or 0.40-1.52 mmol/L (SI units).  Teens 75-34 years old: ? Male: 40-163 mg/dL. ? Male: 40-128 mg/dL.  Children 53-52 years old: ? Male: 36-138 mg/dL. ? Male: 41-138 mg/dL.  Children 34-66 years old: ? Male: 31-108 mg/dL. ? Male: 35-114 mg/dL.  Children 5 years or younger: ? Male: 30-86 mg/dL. ? Male: 32-99 mg/dL. What do the results mean? Results that are within the reference range are considered normal. This means that you have a normal amount of triglycerides in your blood. Results that are higher than your reference range mean that there are too many triglycerides in your blood. This may mean that you:  Have a higher risk of heart disease.  Have certain diseases that cause high triglycerides, such as diabetes.  Are taking certain medicines such as estrogens and oral contraceptives. Results that are lower than your reference range mean that there are too few triglycerides in your blood. This may mean that you are not getting enough nutrients in your diet (malnutrition). Talk with your health care provider about what your results mean. Questions to ask your health care provider Ask your health care provider, or the department that is doing the test:  When will my results be ready?  How will I get my results?  What are my treatment options?  What other tests do I need?  What are  my next steps? Summary  Triglycerides are a type of fat in the body. Having a high level of triglycerides can increase your risk for heart disease.  You may have this test as part of a routine physical exam. Triglycerides may be measured as part of a test called a lipid profile, which tests triglycerides and cholesterol.  Talk with your health care provider  about what your results mean. This information is not intended to replace advice given to you by your health care provider. Make sure you discuss any questions you have with your health care provider. Document Revised: 11/17/2019 Document Reviewed: 11/17/2019 Elsevier Patient Education  2021 [employer].

## 2020-10-05 NOTE — Progress Notes (Signed)
Pt here for refills on Crestor 10 mg. Taking one daily. No issues.     Patient ID: Aaron Griffin, male    DOB: [birth year], 55 y.o.   MRN: [id number]   Chief Complaint  Patient presents with  . Hyperlipidemia   Subjective:  CC: follow-up for medication managment  Presents today for medication management for hyperlipidemia.  Was last seen by Dr. Sallee Lange on December 2, recommend repeat labs in 3 months.  Labs due approximately October 12, 2020.  Needs refill for rosuvastatin today.  Denies fever, chills, chest pain, shortness of breath, any muscle symptoms related to statin therapy.    Medical History Aaron Griffin has a past medical history of Bulla of lung ([place]), Emphysema lung ([place]), and Sleep apnea.   Outpatient Encounter Medications as of 10/05/2020  Medication Sig  . diphenhydrAMINE HCl 50 MG/30ML LIQD Take 50 mg by mouth at bedtime.   . gabapentin (NEURONTIN) 600 MG tablet Take 1 tablet (600 mg total) by mouth 3 (three) times daily.  . [DISCONTINUED] rosuvastatin (CRESTOR) 10 MG tablet TAKE 1 TABLET(10 MG) BY MOUTH DAILY  . rosuvastatin (CRESTOR) 10 MG tablet TAKE 1 TABLET(10 MG) BY MOUTH DAILY   No facility-administered encounter medications on file as of 10/05/2020.     Review of Systems  Constitutional: Negative for chills and fever.  HENT: Negative for congestion.   Respiratory: Negative for cough and shortness of breath.   Cardiovascular: Negative for chest pain and leg swelling.  Gastrointestinal: Negative for abdominal pain, nausea and vomiting.  Musculoskeletal: Negative for myalgias.     Vitals BP 124/82   Pulse (!) 101   Temp (!) 96.7 F (35.9 C)   Wt 216 lb 6.4 oz (98.2 kg)   SpO2 98%   BMI 31.96 kg/m   Objective:   Physical Exam Vitals reviewed.  Cardiovascular:     Rate and Rhythm: Normal rate and regular rhythm.     Heart sounds: Normal heart sounds.  Pulmonary:     Effort: Pulmonary effort is normal.     Breath sounds: Normal breath sounds.   Musculoskeletal:        General: No tenderness.     Comments: Denies muscle symptoms.  Skin:    General: Skin is warm and dry.  Neurological:     General: No focal deficit present.     Mental Status: He is alert.  Psychiatric:        Behavior: Behavior normal.      Assessment and Plan   1. Hypertriglyceridemia - Comprehensive Metabolic Panel (CMET)  2. Hyperglycemia - Comprehensive Metabolic Panel (CMET) - HgB A1c  3. Hyperlipidemia, unspecified hyperlipidemia type - rosuvastatin (CRESTOR) 10 MG tablet; TAKE 1 TABLET(10 MG) BY MOUTH DAILY  Dispense: 30 tablet; Refill: 2 - Lipid Profile    Hyperlipidemia:  Medication compliance: taking as prescribed.  Denies chest pain, shortness of breath, lower extremity edema, myalgias, (aches, soreness, stiffness, tenderness, cramps), muscle weakness, changes in appearance of urine. Pertinent lab work: due October 12, 2020.  Monitoring: every three months Continue current medication regimen: Continue rosuvastatin 10 mg daily as prescribed.  Refill sent today for rosuvastatin.  Will repeat labs on March 3, paperwork given.  Understands to follow-up in 3 months, will attempt to get lab work done prior to follow-up visits.  Agrees with plan of care discussed today. Understands warning signs to seek further care: chest pain, shortness of breath, any significant change in health.  Understands to follow-up in 3 months for  medication management for hyperlipidemia.  Lab work prior to that visit.  He will let us know if he needs anything sooner.  Consultation with Dr. Sallee Lange while patient in office today.  Pecolia Ades, NP 10/05/2020

## 2020-10-10 ENCOUNTER — Other Ambulatory Visit: Payer: Self-pay | Admitting: *Deleted

## 2020-10-10 DIAGNOSIS — E785 Hyperlipidemia, unspecified: Secondary | ICD-10-CM

## 2020-10-10 DIAGNOSIS — Z79899 Other long term (current) drug therapy: Secondary | ICD-10-CM

## 2020-10-10 DIAGNOSIS — R739 Hyperglycemia, unspecified: Secondary | ICD-10-CM

## 2020-10-13 ENCOUNTER — Encounter: Payer: Self-pay | Admitting: Family Medicine

## 2020-10-13 LAB — LIPID PANEL
Chol/HDL Ratio: 4.2 ratio (ref 0.0–5.0)
Cholesterol, Total: 156 mg/dL (ref 100–199)
HDL: 37 mg/dL — ABNORMAL LOW (ref >39)
LDL Chol Calc (NIH): 91 mg/dL (ref 0–99)
Triglycerides: 159 mg/dL — ABNORMAL HIGH (ref 0–149)
VLDL Cholesterol Cal: 28 mg/dL (ref 5–40)

## 2020-10-13 LAB — BASIC METABOLIC PANEL
BUN/Creatinine Ratio: 14 (ref 9–20)
BUN: 16 mg/dL (ref 6–24)
CO2: 24 mmol/L (ref 20–29)
Calcium: 9.9 mg/dL (ref 8.7–10.2)
Chloride: 99 mmol/L (ref 96–106)
Creatinine, Ser: 1.13 mg/dL (ref 0.76–1.27)
Glucose: 114 mg/dL — ABNORMAL HIGH (ref 65–99)
Potassium: 4.3 mmol/L (ref 3.5–5.2)
Sodium: 138 mmol/L (ref 134–144)
eGFR: 78 mL/min/{1.73_m2} (ref >59)

## 2020-10-13 LAB — HEPATIC FUNCTION PANEL
ALT: 39 IU/L (ref 0–44)
AST: 28 IU/L (ref 0–40)
Albumin: 4.9 g/dL (ref 3.8–4.9)
Alkaline Phosphatase: 76 IU/L (ref 44–121)
Bilirubin Total: 0.4 mg/dL (ref 0.0–1.2)
Bilirubin, Direct: 0.13 mg/dL (ref 0.00–0.40)
Total Protein: 7.5 g/dL (ref 6.0–8.5)

## 2020-10-13 LAB — HEMOGLOBIN A1C
Est. average glucose Bld gHb Est-mCnc: 134 mg/dL
Hgb A1c MFr Bld: 6.3 % — ABNORMAL HIGH (ref 4.8–5.6)

## 2020-10-20 LAB — SPECIMEN STATUS REPORT

## 2020-10-24 LAB — SPECIMEN STATUS REPORT

## 2020-10-24 LAB — PSA: Prostate Specific Ag, Serum: 0.5 ng/mL (ref 0.0–4.0)

## 2020-10-31 ENCOUNTER — Telehealth: Payer: Self-pay | Admitting: *Deleted

## 2020-10-31 NOTE — Telephone Encounter (Signed)
Patient completed labs ordered form reminder file- 10/12/20

## 2020-11-06 IMAGING — DX DG CHEST 1V PORT
1 series · 1 of 1 positions shown · non-contrast
Comparison: 05/18/2020.

CLINICAL DATA: Chest tube.

EXAM:
PORTABLE CHEST 1 VIEW

[chest]
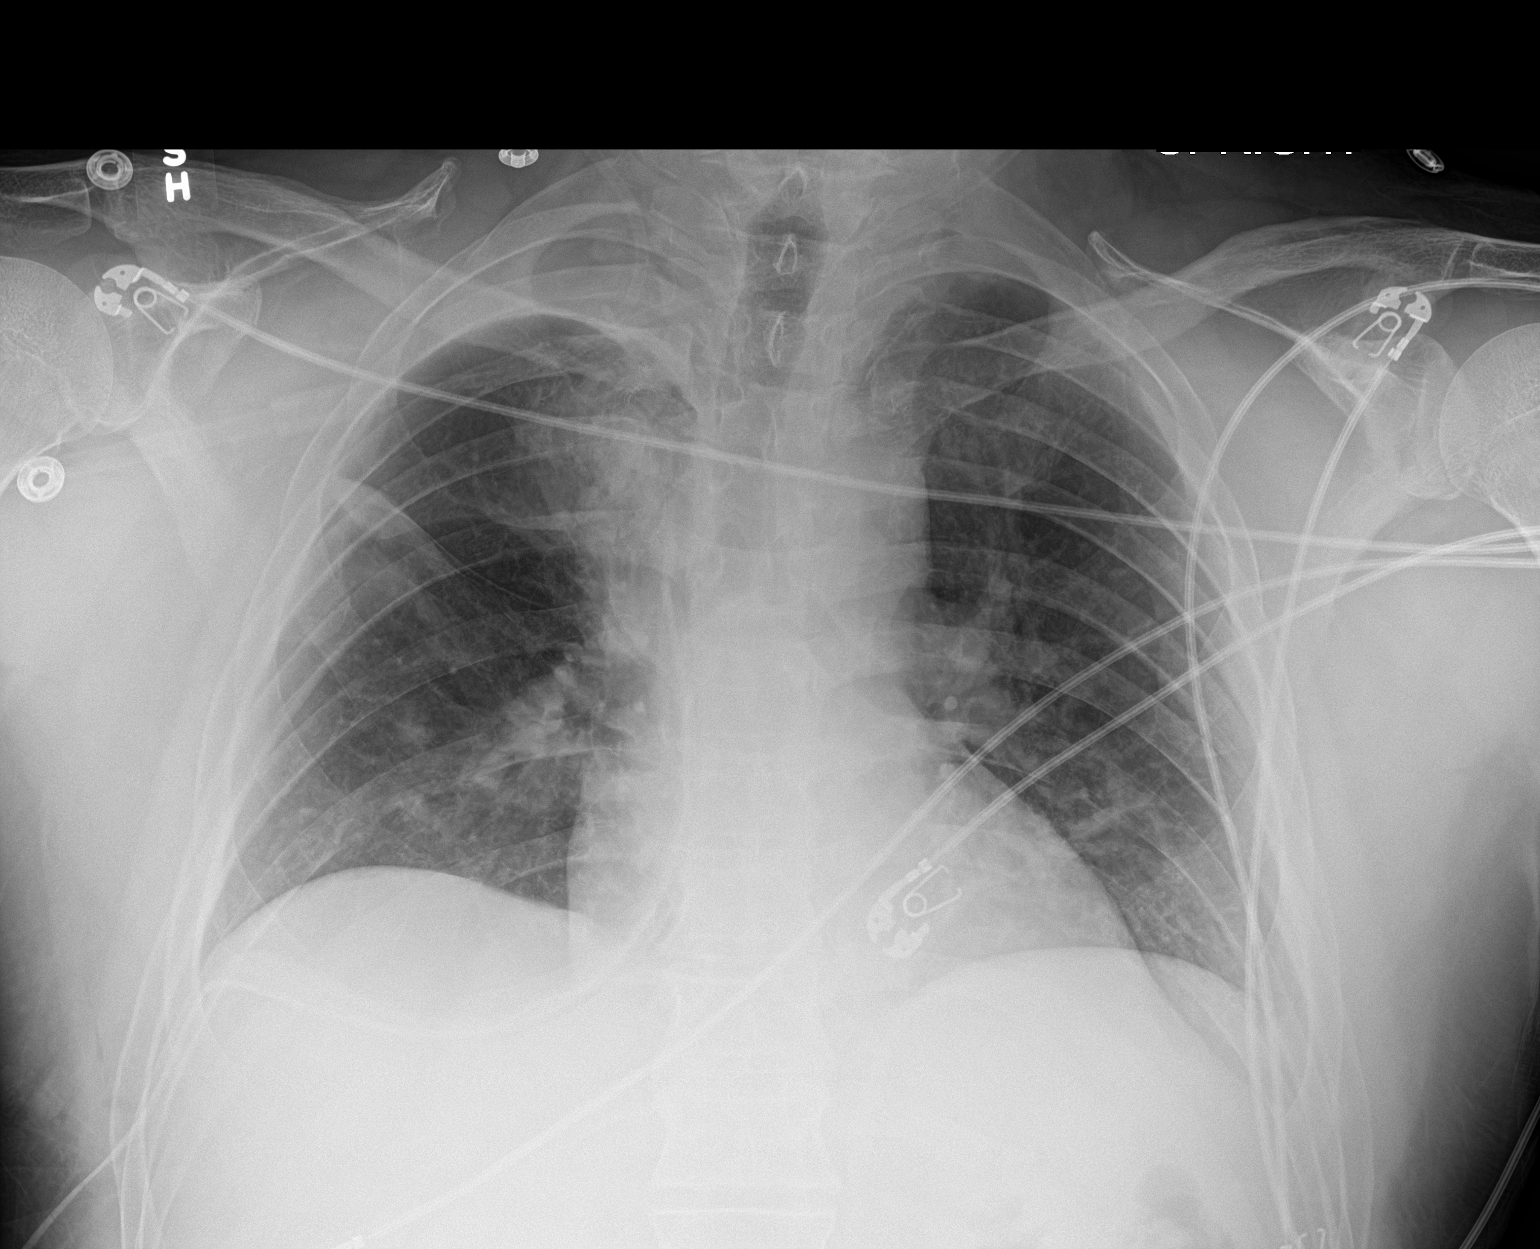

[1 of 1 positions shown; findings below may reference images not displayed]

FINDINGS: Right chest tube again noted. No definite pneumothorax. Increased
right apical pleural fluid noted on today's exam. Postsurgical
changes right upper lung again noted. Bilateral subsegmental
atelectasis again noted. Heart size normal. No acute bony
abnormality.
IMPRESSION: 1. Right chest tube again noted. No definite pneumothorax. Increased
right apical pleural fluid noted on today's exam.

2. Postsurgical changes right upper lung again noted. Bilateral
subsegmental atelectasis again noted.

## 2020-11-08 IMAGING — DX DG CHEST 1V PORT
1 series · 1 of 1 positions shown · non-contrast
Comparison: Chest radiograph 05/20/2020

CLINICAL DATA: Postoperative evaluation

EXAM:
PORTABLE CHEST 1 VIEW

[chest ap]
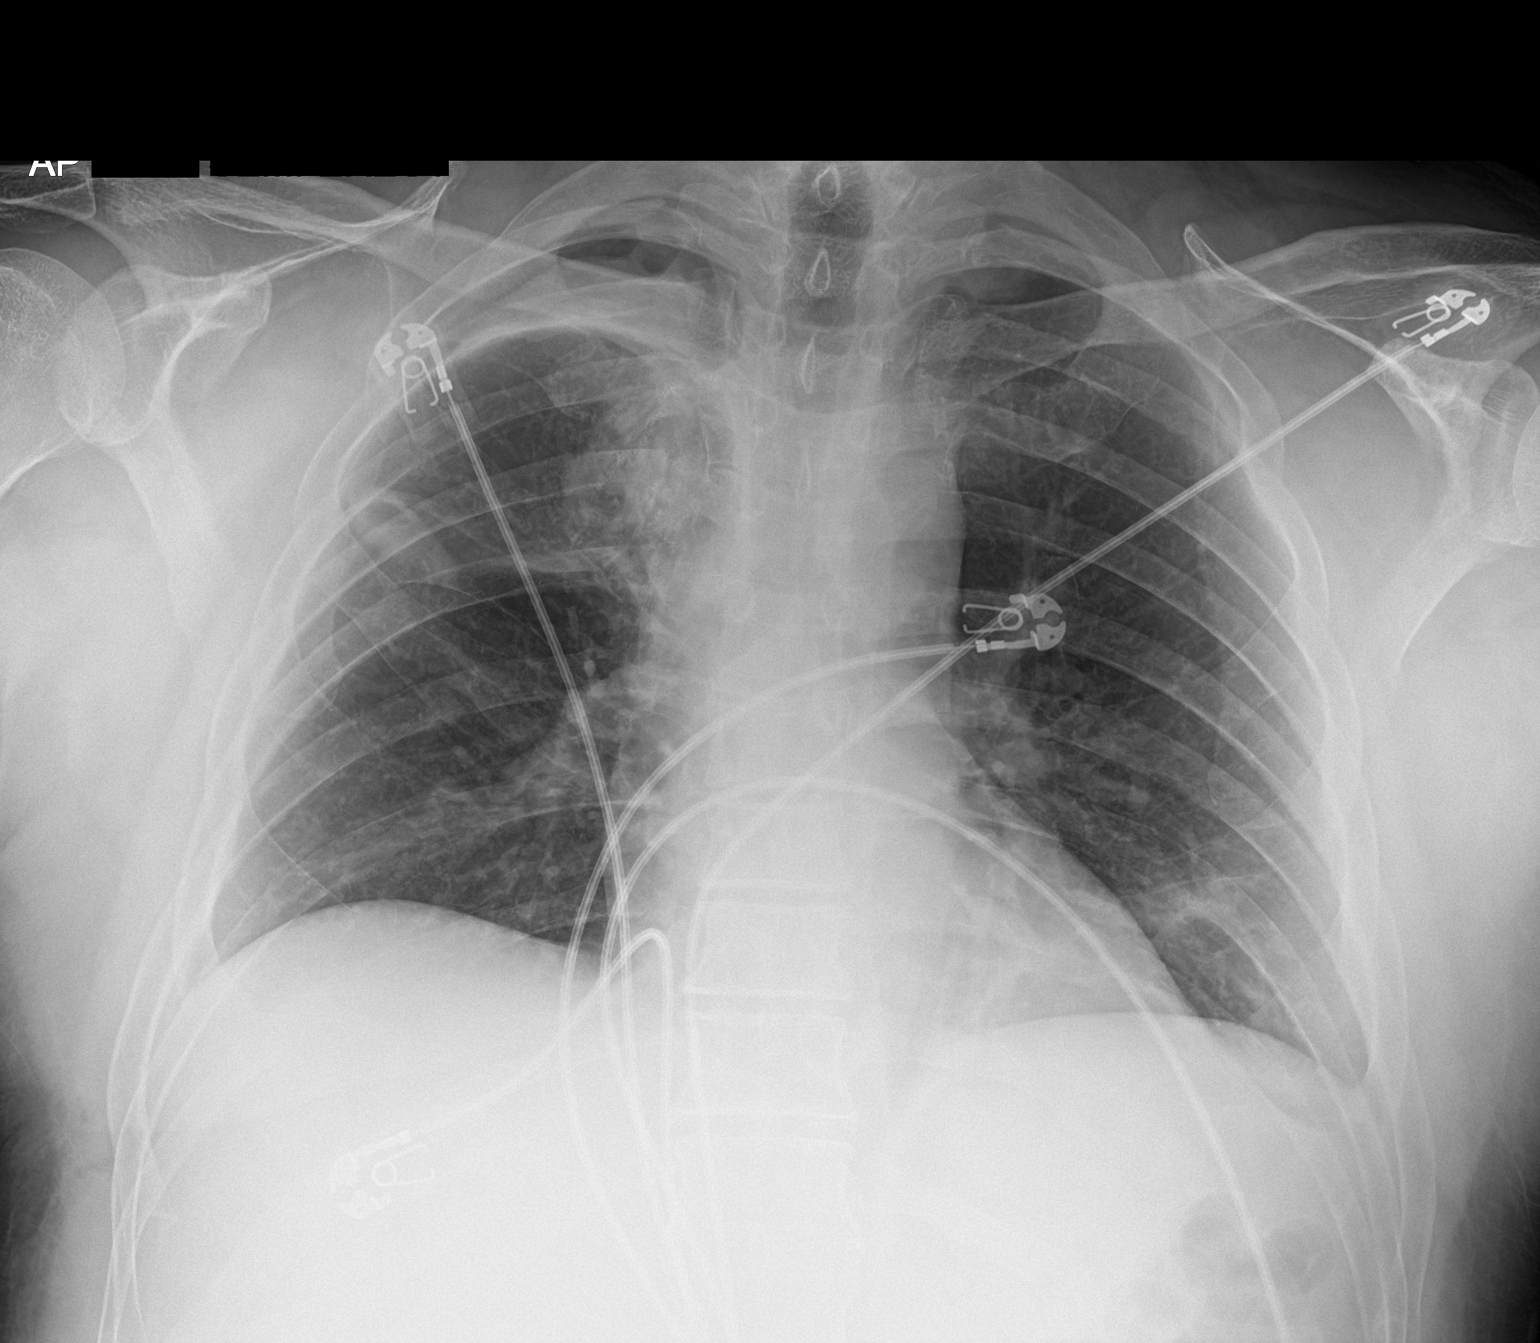

[1 of 1 positions shown; findings below may reference images not displayed]

FINDINGS: Monitoring leads overlie the patient. Stable cardiac and mediastinal
contours. Similar-appearing heterogeneous opacities left lung base
and right mid lung. No definite pleural effusion. Small right apical
pneumothorax. Postsurgical changes right mid and upper hemithorax.
IMPRESSION: 1. Small right apical pneumothorax.
2. Similar-appearing left lung base and right mid lung heterogeneous
opacities.
3. These results will be called to the ordering clinician or
representative by the Radiologist Assistant, and communication
documented in the PACS or [REDACTED].

## 2020-11-16 ENCOUNTER — Other Ambulatory Visit: Payer: Self-pay | Admitting: Thoracic Surgery (Cardiothoracic Vascular Surgery)

## 2020-11-16 NOTE — Telephone Encounter (Signed)
Can you please close this?  Thank you  

## 2020-11-29 ENCOUNTER — Other Ambulatory Visit: Payer: Self-pay | Admitting: *Deleted

## 2020-11-29 DIAGNOSIS — E785 Hyperlipidemia, unspecified: Secondary | ICD-10-CM

## 2020-11-29 DIAGNOSIS — R739 Hyperglycemia, unspecified: Secondary | ICD-10-CM

## 2020-11-29 DIAGNOSIS — Z79899 Other long term (current) drug therapy: Secondary | ICD-10-CM

## 2021-01-02 ENCOUNTER — Ambulatory Visit: Payer: 59 | Admitting: Thoracic Surgery (Cardiothoracic Vascular Surgery)

## 2021-01-02 ENCOUNTER — Other Ambulatory Visit: Payer: Self-pay

## 2021-01-02 ENCOUNTER — Encounter: Payer: Self-pay | Admitting: Thoracic Surgery (Cardiothoracic Vascular Surgery)

## 2021-01-02 VITALS — BP 117/80 | HR 78 | Resp 20 | Ht 69.0 in | Wt 217.0 lb

## 2021-01-02 DIAGNOSIS — Z09 Encounter for follow-up examination after completed treatment for conditions other than malignant neoplasm: Secondary | ICD-10-CM

## 2021-01-02 DIAGNOSIS — J439 Emphysema, unspecified: Secondary | ICD-10-CM

## 2021-01-02 NOTE — Progress Notes (Signed)
      [address]       [place] [address]             [phone]     HPI: Aaron Griffin returns for follow-up after robotic resection of a giant bulla.  Aaron Griffin is a 55 year old man with a history of tobacco abuse, sleep apnea, aortic atherosclerosis, and a giant bulla of the right lung.  In the summer 2021 he had an infected bulla.  That was treated with antibiotics.  We have been did a robotic assisted resection on 05/18/2020.  He had a lot of neuropathic pain postoperatively.  In November we started him on gabapentin 300 mg 3 times daily.  He was still having a lot of pain in January and we increased it to 600 mg 3 times daily.  He continues to have neuropathic pain in the right upper quadrant and costal margin region.  He says that his more of a constant nuisance but the pain is actually very mild.  If something rubs across that area, he has more severe pain.  Past Medical History:  Diagnosis Date  . Bulla of lung ([place])   . Emphysema lung ([place])   . Sleep apnea    cpap    Current Outpatient Medications  Medication Sig Dispense Refill  . diphenhydrAMINE HCl 50 MG/30ML LIQD Take 50 mg by mouth at bedtime.     . gabapentin (NEURONTIN) 600 MG tablet TAKE 1 TABLET(600 MG) BY MOUTH THREE TIMES DAILY 90 tablet 1  . rosuvastatin (CRESTOR) 10 MG tablet TAKE 1 TABLET(10 MG) BY MOUTH DAILY 30 tablet 2   No current facility-administered medications for this visit.    Physical Exam BP 117/80   Pulse 78   Resp 20   Ht 5\' 9"  (1.753 m)   Wt 217 lb (98.4 kg)   SpO2 98% Comment: RA  BMI 32.75 kg/m  55 year old man in no acute distress Alert and oriented x3 with no focal deficits Lungs clear with equal breath sounds bilaterally Incisions well-healed  Diagnostic Tests: No new studies today  Impression: Aaron Griffin is a 55 year old man with a history of tobacco abuse, sleep apnea, aortic atherosclerosis, and giant bulla of the lung.  He underwent resection of  the giant of bulla after being treated for an infection.  Surgery went smoothly but he has had problems with neuropathic pain since then.  He continues to have some neuropathic pain.  The pain usually is not severe being about a 1 on a scale of 1-10.  However he does have times when it is worse.  He complains of feeling tired all the time and wonders if the gabapentin may be contributing to that.  I recommended that we try cutting back that gabapentin to 300 mg 3 times daily.  He will just use the prescription he has and take half a pill instead of the full pill.  We will see if that helps his lack of energy and whether it has any effect on the amount of pain he has.   Plan: Decrease gabapentin to 300 mg 3 times daily  Return in 3 months  I spent 10 minutes in review of records, images, and in consultation with [name] today Melrose Nakayama, MD [facility] [phone]

## 2021-01-03 ENCOUNTER — Other Ambulatory Visit: Payer: Self-pay | Admitting: Family Medicine

## 2021-01-03 DIAGNOSIS — E785 Hyperlipidemia, unspecified: Secondary | ICD-10-CM

## 2021-01-17 ENCOUNTER — Other Ambulatory Visit: Payer: Self-pay | Admitting: Thoracic Surgery (Cardiothoracic Vascular Surgery)

## 2021-01-19 LAB — COMPREHENSIVE METABOLIC PANEL
ALT: 37 IU/L (ref 0–44)
AST: 19 IU/L (ref 0–40)
Albumin/Globulin Ratio: 1.6 (ref 1.2–2.2)
Albumin: 4.6 g/dL (ref 3.8–4.9)
Alkaline Phosphatase: 66 IU/L (ref 44–121)
BUN/Creatinine Ratio: 14 (ref 9–20)
BUN: 15 mg/dL (ref 6–24)
Bilirubin Total: 0.3 mg/dL (ref 0.0–1.2)
CO2: 23 mmol/L (ref 20–29)
Calcium: 9.6 mg/dL (ref 8.7–10.2)
Chloride: 104 mmol/L (ref 96–106)
Creatinine, Ser: 1.11 mg/dL (ref 0.76–1.27)
Globulin, Total: 2.9 g/dL (ref 1.5–4.5)
Glucose: 116 mg/dL — ABNORMAL HIGH (ref 65–99)
Potassium: 4.5 mmol/L (ref 3.5–5.2)
Sodium: 140 mmol/L (ref 134–144)
Total Protein: 7.5 g/dL (ref 6.0–8.5)
eGFR: 80 mL/min/{1.73_m2} (ref >59)

## 2021-01-19 LAB — HEMOGLOBIN A1C
Est. average glucose Bld gHb Est-mCnc: 134 mg/dL
Hgb A1c MFr Bld: 6.3 % — ABNORMAL HIGH (ref 4.8–5.6)

## 2021-01-19 LAB — LIPID PANEL
Chol/HDL Ratio: 3.7 ratio (ref 0.0–5.0)
Cholesterol, Total: 157 mg/dL (ref 100–199)
HDL: 43 mg/dL (ref >39)
LDL Chol Calc (NIH): 89 mg/dL (ref 0–99)
Triglycerides: 141 mg/dL (ref 0–149)
VLDL Cholesterol Cal: 25 mg/dL (ref 5–40)

## 2021-01-24 ENCOUNTER — Encounter: Payer: Self-pay | Admitting: Family Medicine

## 2021-01-24 ENCOUNTER — Ambulatory Visit: Payer: 59 | Admitting: Family Medicine

## 2021-01-24 ENCOUNTER — Other Ambulatory Visit: Payer: Self-pay

## 2021-01-24 VITALS — BP 104/70 | HR 69 | Temp 97.2°F | Wt 215.2 lb

## 2021-01-24 DIAGNOSIS — E7849 Other hyperlipidemia: Secondary | ICD-10-CM | POA: Diagnosis not present

## 2021-01-24 DIAGNOSIS — Z1211 Encounter for screening for malignant neoplasm of colon: Secondary | ICD-10-CM | POA: Diagnosis not present

## 2021-01-24 DIAGNOSIS — R1310 Dysphagia, unspecified: Secondary | ICD-10-CM

## 2021-01-24 DIAGNOSIS — E785 Hyperlipidemia, unspecified: Secondary | ICD-10-CM | POA: Diagnosis not present

## 2021-01-24 DIAGNOSIS — R7303 Prediabetes: Secondary | ICD-10-CM

## 2021-01-24 MED ORDER — ROSUVASTATIN CALCIUM 10 MG PO TABS: ORAL_TABLET | ORAL | 1 refills | Status: DC

## 2021-01-24 NOTE — Progress Notes (Signed)
   Subjective:    Patient ID: Aaron Griffin, male    DOB: 1966-01-22, 55 y.o.   MRN: [id number]  HPI Pt here for follow up on cholesterol. Pt is taking Rosuvastatin 10 mg daily.   Pt feels weak in arm joints at elbow region; noticed more after COVID shot and starting Rosuvastatin close together.    Review of Systems     Objective:   Physical Exam Lungs clear heart regular extremities no edema skin warm dry       Assessment & Plan:  1. Other hyperlipidemia Continue cholesterol medicine.  It is hard to know that this is causing him problems I feel that more likely it is not but if the patient would like to come off the medicine for a month then restart medicine and see if his elbow situation is better he can and he will let us know the results otherwise I would recommend co-Q10 daily and follow-up again in 6 months  2. Screening for colon cancer Referral for colonoscopy.  3. Dysphagia, unspecified type Patient has intermittent dysphagia.  No severe reflux symptoms.  Hopefully when GI sees him they will do EGD to help determine if he has any type of stricture or other issues  4. Hyperlipidemia, unspecified hyperlipidemia type Continue cholesterol medicine.  Recent lab work looks improved.  Patient to follow-up in 6 months. - rosuvastatin (CRESTOR) 10 MG tablet; 1 qd  Dispense: 90 tablet; Refill: 1  5. Encounter for screening colonoscopy Colonoscopy referral. - Ambulatory referral to Gastroenterology Prediabetes-watch starches stay active recheck 6 months  Mild obesity minimize portions stay physically active patient will try intermittent fasting with a 8-hour window to eat

## 2021-01-24 NOTE — Patient Instructions (Signed)
Try taking Co Q 10 as a supplement daily  Results for orders placed or performed in visit on 11/29/20  Comprehensive metabolic panel  Result Value Ref Range   Glucose 116 (H) 65 - 99 mg/dL   BUN 15 6 - 24 mg/dL   Creatinine, Ser 1.11 0.76 - 1.27 mg/dL   eGFR 80 >59 mL/min/1.73   BUN/Creatinine Ratio 14 9 - 20   Sodium 140 134 - 144 mmol/L   Potassium 4.5 3.5 - 5.2 mmol/L   Chloride 104 96 - 106 mmol/L   CO2 23 20 - 29 mmol/L   Calcium 9.6 8.7 - 10.2 mg/dL   Total Protein 7.5 6.0 - 8.5 g/dL   Albumin 4.6 3.8 - 4.9 g/dL   Globulin, Total 2.9 1.5 - 4.5 g/dL   Albumin/Globulin Ratio 1.6 1.2 - 2.2   Bilirubin Total 0.3 0.0 - 1.2 mg/dL   Alkaline Phosphatase 66 44 - 121 IU/L   AST 19 0 - 40 IU/L   ALT 37 0 - 44 IU/L  Lipid panel  Result Value Ref Range   Cholesterol, Total 157 100 - 199 mg/dL   Triglycerides 141 0 - 149 mg/dL   HDL 43 >39 mg/dL   VLDL Cholesterol Cal 25 5 - 40 mg/dL   LDL Chol Calc (NIH) 89 0 - 99 mg/dL   Chol/HDL Ratio 3.7 0.0 - 5.0 ratio  Hemoglobin A1c  Result Value Ref Range   Hgb A1c MFr Bld 6.3 (H) 4.8 - 5.6 %   Est. average glucose Bld gHb Est-mCnc 134 mg/dL

## 2021-04-03 ENCOUNTER — Other Ambulatory Visit: Payer: Self-pay

## 2021-04-03 ENCOUNTER — Ambulatory Visit: Payer: 59 | Admitting: Thoracic Surgery (Cardiothoracic Vascular Surgery)

## 2021-04-03 ENCOUNTER — Encounter: Payer: Self-pay | Admitting: Thoracic Surgery (Cardiothoracic Vascular Surgery)

## 2021-04-03 VITALS — BP 111/76 | HR 70 | Resp 18 | Ht 69.0 in | Wt 221.0 lb

## 2021-04-03 DIAGNOSIS — Z9889 Other specified postprocedural states: Secondary | ICD-10-CM | POA: Diagnosis not present

## 2021-04-03 MED ORDER — GABAPENTIN 300 MG PO CAPS: 300.0000 mg | ORAL_CAPSULE | Freq: Three times a day (TID) | ORAL | 5 refills | Status: DC

## 2021-04-03 NOTE — Progress Notes (Signed)
      [address]       [place] [address]             [phone]     HPI: Mr. Blancett returns for a scheduled follow-up visit regarding post surgical pain  Shaheim Mcaden is a 55 year old man with a history of tobacco abuse, sleep apnea, aortic atherosclerosis, and giant bulla of the right lung.  He had an infection involving the giant bulla that was treated with antibiotics.  I did a robotic assisted resection of the bulla on 05/18/2020.  He had a lot of neuropathic pain postoperatively.  Initially started on gabapentin 300 mg 3 times daily and then increase to 600 mg 3 times daily in January.  At his last visit in May we drop the dose back down to 300 mg 3 times a day.  He says that he does have a little more pain on the 300 mg dose in the 600 mg but not enough that he wants to go back up on the medication.  He says the pain tends to occur when he has contact the area around the incision and then it radiates to the right upper quadrant and costal margin region.  Past Medical History:  Diagnosis Date   Bulla of lung (HCC)    Emphysema lung (HCC)    Sleep apnea    cpap    Current Outpatient Medications  Medication Sig Dispense Refill   diphenhydrAMINE HCl 50 MG/30ML LIQD Take 50 mg by mouth at bedtime.      rosuvastatin (CRESTOR) 10 MG tablet 1 qd 90 tablet 1   gabapentin (NEURONTIN) 300 MG capsule Take 1 capsule (300 mg total) by mouth 3 (three) times daily. 90 capsule 5   No current facility-administered medications for this visit.    Physical Exam BP 111/76 (BP Location: Left Arm, Patient Position: Sitting, Cuff Size: Large)   Pulse 70   Resp 18   Ht '5\' 9"'$  (1.753 m)   Wt 221 lb (100.2 kg)   SpO2 98% Comment: RA  BMI 32.67 kg/m  55 year old man in no acute distress Well-developed and well-nourished Lungs clear equal breath sounds bilaterally Incisions well-healed  Diagnostic Tests: none  Impression: Cardale Wittkopp is a 55 year old man who had a  robotic assisted resection of a giant bulla in October 2021.  He did well overall, but has had issues with intercostal neuralgia.  He currently is taking gabapentin 300 mg 3 times daily.  He does still have some pain in that area but it is something that he can live with and is not having to take any narcotics for.  He is not having any significant side effects with the gabapentin.  We discussed whether or not to try to wean that medication more.  At the present time, we will continue with 300 mg 3 times daily.  Plan: Continue gabapentin 300 mg 3 times a day, new prescription sent. Return in 6 months to check on progress.  I spent over 10 minutes in review of records and in consultation with Mr. Specker today. Melrose Nakayama, MD [facility] [phone]

## 2021-07-24 ENCOUNTER — Telehealth: Payer: Self-pay | Admitting: Family Medicine

## 2021-07-24 DIAGNOSIS — E7849 Other hyperlipidemia: Secondary | ICD-10-CM

## 2021-07-24 DIAGNOSIS — Z79899 Other long term (current) drug therapy: Secondary | ICD-10-CM

## 2021-07-24 DIAGNOSIS — Z1159 Encounter for screening for other viral diseases: Secondary | ICD-10-CM

## 2021-07-24 DIAGNOSIS — Z114 Encounter for screening for human immunodeficiency virus [HIV]: Secondary | ICD-10-CM

## 2021-07-24 DIAGNOSIS — R7303 Prediabetes: Secondary | ICD-10-CM

## 2021-07-24 NOTE — Telephone Encounter (Signed)
Lab orders placed. Detailed message left on voicemail (ok per DPR) 

## 2021-07-24 NOTE — Telephone Encounter (Signed)
Lipid, liver, metabolic 7, V8Z, urine ACR Hepatitis C antibody, HIV antibody per CDC guideline  Hyperlipidemia, prediabetes

## 2021-07-24 NOTE — Telephone Encounter (Signed)
Patient needing labs for 6 month follow up on 12/15. Please advise

## 2021-07-26 ENCOUNTER — Other Ambulatory Visit: Payer: Self-pay

## 2021-07-26 ENCOUNTER — Ambulatory Visit: Payer: 59 | Admitting: Family Medicine

## 2021-07-26 VITALS — BP 118/68 | HR 70 | Temp 98.1°F | Ht 69.0 in | Wt 214.8 lb

## 2021-07-26 DIAGNOSIS — E785 Hyperlipidemia, unspecified: Secondary | ICD-10-CM

## 2021-07-26 DIAGNOSIS — Z1211 Encounter for screening for malignant neoplasm of colon: Secondary | ICD-10-CM | POA: Diagnosis not present

## 2021-07-26 MED ORDER — ROSUVASTATIN CALCIUM 10 MG PO TABS: ORAL_TABLET | ORAL | 1 refills | Status: DC

## 2021-07-26 NOTE — Progress Notes (Signed)
° °  Subjective:    Patient ID: Aaron Griffin, male    DOB: 05/01/1966, 55 y.o.   MRN: [id number]  Hyperlipidemia This is a chronic problem. The current episode started more than 1 year ago. Treatments tried: Crestor. Risk factors for coronary artery disease include a sedentary lifestyle and dyslipidemia.   He relates that he is working harder healthy eating he is trying a version of keto diet states he has lost some weight and still Thanksgiving and put some weight back but now he started bring weight down is try to get some walking in  Sees thoracic surgery every 6 months being treated with gabapentin for nerve related issue from his surgery  Review of Systems     Objective:   Physical Exam  General-in no acute distress Eyes-no discharge Lungs-respiratory rate normal, CTA CV-no murmurs,RRR Extremities skin warm dry no edema Neuro grossly normal Behavior normal, alert       Assessment & Plan:  Patient had blood work this morning we await the results of this  Hyperlipidemia continue medication watch diet stay active  Weight-healthy diet regular activity  Follow through with cardiothoracic surgery if at some point they let him lose and he desires did not get his gabapentin through [place] that would be fine

## 2021-07-26 NOTE — Patient Instructions (Addendum)
Mediterranean Diet °A Mediterranean diet refers to food and lifestyle choices that are based on the traditions of countries located on the [place] Sea. It focuses on eating more fruits, vegetables, whole grains, beans, nuts, seeds, and heart-healthy fats, and eating less dairy, meat, eggs, and processed foods with added sugar, salt, and fat. This way of eating has been shown to help prevent certain conditions and improve outcomes for people who have chronic diseases, like kidney disease and heart disease. °What are tips for following this plan? °Reading food labels °Check the serving size of packaged foods. For foods such as rice and pasta, the serving size refers to the amount of cooked product, not dry. °Check the total fat in packaged foods. Avoid foods that have saturated fat or trans fats. °Check the ingredient list for added sugars, such as corn syrup. °Shopping ° °Buy a variety of foods that offer a balanced diet, including: °Fresh fruits and vegetables (produce). °Grains, beans, nuts, and seeds. Some of these may be available in unpackaged forms or large amounts (in bulk). °Fresh seafood. °Poultry and eggs. °Low-fat dairy products. °Buy whole ingredients instead of prepackaged foods. °Buy fresh fruits and vegetables in-season from local farmers markets. °Buy plain frozen fruits and vegetables. °If you do not have access to quality fresh seafood, buy precooked frozen shrimp or canned fish, such as tuna, salmon, or sardines. °Stock your pantry so you always have certain foods on hand, such as olive oil, canned tuna, canned tomatoes, rice, pasta, and beans. °Cooking °Cook foods with extra-virgin olive oil instead of using butter or other vegetable oils. °Have meat as a side dish, and have vegetables or grains as your main dish. This means having meat in small portions or adding small amounts of meat to foods like pasta or stew. °Use beans or vegetables instead of meat in common dishes like chili or  lasagna. °Experiment with different cooking methods. Try roasting, broiling, steaming, and sautéing vegetables. °Add frozen vegetables to soups, stews, pasta, or rice. °Add nuts or seeds for added healthy fats and plant protein at each meal. You can add these to yogurt, salads, or vegetable dishes. °Marinate fish or vegetables using olive oil, lemon juice, garlic, and fresh herbs. °Meal planning °Plan to eat one vegetarian meal one day each week. Try to work up to two vegetarian meals, if possible. °Eat seafood two or more times a week. °Have healthy snacks readily available, such as: °Vegetable sticks with hummus. °Greek yogurt. °Fruit and nut trail mix. °Eat balanced meals throughout the week. This includes: °Fruit: 2-3 servings a day. °Vegetables: 4-5 servings a day. °Low-fat dairy: 2 servings a day. °Fish, poultry, or lean meat: 1 serving a day. °Beans and legumes: 2 or more servings a week. °Nuts and seeds: 1-2 servings a day. °Whole grains: 6-8 servings a day. °Extra-virgin olive oil: 3-4 servings a day. °Limit red meat and sweets to only a few servings a month. °Lifestyle ° °Cook and eat meals together with your family, when possible. °Drink enough fluid to keep your urine pale yellow. °Be physically active every day. This includes: °Aerobic exercise like running or swimming. °Leisure activities like gardening, walking, or housework. °Get 7-8 hours of sleep each night. °If recommended by your health care provider, drink red wine in moderation. This means 1 glass a day for nonpregnant women and 2 glasses a day for men. A glass of wine equals 5 oz (150 mL). °What foods should I eat? °Fruits °Apples. Apricots. Avocado. Berries. Bananas. Cherries. Dates.   Figs. Grapes. Lemons. Melon. Oranges. Peaches. Plums. Pomegranate. Vegetables Artichokes. Beets. Broccoli. Cabbage. Carrots. Eggplant. Green beans. Chard. Kale. Spinach. Onions. Leeks. Peas. Squash. Tomatoes. Peppers. Radishes. Grains Whole-grain pasta. Brown  rice. Bulgur wheat. Polenta. Couscous. Whole-wheat bread. [place]. Meats and other proteins Beans. Almonds. Sunflower seeds. Pine nuts. Peanuts. [place]. Salmon. Scallops. Shrimp. [place]. Tilapia. Clams. Oysters. Eggs. Poultry without skin. Dairy Low-fat milk. Cheese. Greek yogurt. Fats and oils Extra-virgin olive oil. Avocado oil. Grapeseed oil. Beverages Water. Red wine. Herbal tea. Sweets and desserts Greek yogurt with honey. Baked apples. Poached pears. Trail mix. Seasonings and condiments Basil. Cilantro. Coriander. Cumin. Mint. Parsley. Sage. Rosemary. Tarragon. Garlic. Oregano. Thyme. Pepper. Balsamic vinegar. Tahini. Hummus. Tomato sauce. Olives. Mushrooms. The items listed above may not be a complete list of foods and beverages you can eat. Contact a dietitian for more information. What foods should I limit? This is a list of foods that should be eaten rarely or only on special occasions. Fruits Fruit canned in syrup. Vegetables Deep-fried potatoes (french fries). Grains Prepackaged pasta or rice dishes. Prepackaged cereal with added sugar. Prepackaged snacks with added sugar. Meats and other proteins Beef. Pork. Lamb. Poultry with skin. Hot dogs. Berniece Salines. Dairy Ice cream. Sour cream. Whole milk. Fats and oils Butter. Canola oil. Vegetable oil. Beef fat (tallow). Lard. Beverages Juice. Sugar-sweetened soft drinks. Beer. Liquor and spirits. Sweets and desserts Cookies. Cakes. Pies. Candy. Seasonings and condiments Mayonnaise. Pre-made sauces and marinades. The items listed above may not be a complete list of foods and beverages you should limit. Contact a dietitian for more information. Summary The Mediterranean diet includes both food and lifestyle choices. Eat a variety of fresh fruits and vegetables, beans, nuts, seeds, and whole grains. Limit the amount of red meat and sweets that you eat. If recommended by your health care provider, drink red wine in moderation.  This means 1 glass a day for nonpregnant women and 2 glasses a day for men. A glass of wine equals 5 oz (150 mL). This information is not intended to replace advice given to you by your health care provider. Make sure you discuss any questions you have with your health care provider. Document Revised: 09/03/2019 Document Reviewed: 07/01/2019 Elsevier Patient Education  2022 [place].  Shingrix and shingles prevention: know the facts!   Shingrix is a very effective vaccine to prevent shingles.   Shingles is a reactivation of chickenpox -more than 99% of Americans born before 1980 have had chickenpox even if they do not remember it. One in every 10 people who get shingles have severe long-lasting nerve pain as a result.   33 out of a 100 older adults will get shingles if they are unvaccinated.     This vaccine is very important for your health This vaccine is indicated for anyone 50 years or older. You can get this vaccine even if you have already had shingles because you can get the disease more than once in a lifetime.  Your risk for shingles and its complications increases with age.  This vaccine has 2 doses.  The second dose would be 2 to 6 months after the first dose.  If you had Zostavax vaccine in the past you should still get Shingrix. ( Zostavax is only 70% effective and it loses significant strength over a few years .)  This vaccine is given through the pharmacy.  The cost of the vaccine is through your insurance. The pharmacy can inform you of the total costs.  Common  side effects including soreness in the arm, some redness and swelling, also some feel fatigue muscle soreness headache low-grade fever.  Side effects typically go away within 2 to 3 days. Remember-the pain from shingles can last a lifetime but these side effects of the vaccine will only last a few days at most. It is very important to get both doses in order to protect yourself fully.   Please get this  vaccine at your earliest convenience at your trusted pharmacy.

## 2021-07-27 LAB — HEPATIC FUNCTION PANEL
ALT: 35 IU/L (ref 0–44)
AST: 27 IU/L (ref 0–40)
Albumin: 4.7 g/dL (ref 3.8–4.9)
Alkaline Phosphatase: 60 IU/L (ref 44–121)
Bilirubin Total: 0.4 mg/dL (ref 0.0–1.2)
Bilirubin, Direct: 0.13 mg/dL (ref 0.00–0.40)
Total Protein: 7.9 g/dL (ref 6.0–8.5)

## 2021-07-27 LAB — LIPID PANEL
Chol/HDL Ratio: 5 ratio (ref 0.0–5.0)
Cholesterol, Total: 174 mg/dL (ref 100–199)
HDL: 35 mg/dL — ABNORMAL LOW (ref >39)
LDL Chol Calc (NIH): 112 mg/dL — ABNORMAL HIGH (ref 0–99)
Triglycerides: 149 mg/dL (ref 0–149)
VLDL Cholesterol Cal: 27 mg/dL (ref 5–40)

## 2021-07-27 LAB — HEMOGLOBIN A1C
Est. average glucose Bld gHb Est-mCnc: 128 mg/dL
Hgb A1c MFr Bld: 6.1 % — ABNORMAL HIGH (ref 4.8–5.6)

## 2021-07-27 LAB — BASIC METABOLIC PANEL
BUN/Creatinine Ratio: 19 (ref 9–20)
BUN: 21 mg/dL (ref 6–24)
CO2: 21 mmol/L (ref 20–29)
Calcium: 9.4 mg/dL (ref 8.7–10.2)
Chloride: 102 mmol/L (ref 96–106)
Creatinine, Ser: 1.12 mg/dL (ref 0.76–1.27)
Glucose: 112 mg/dL — ABNORMAL HIGH (ref 70–99)
Potassium: 4.2 mmol/L (ref 3.5–5.2)
Sodium: 141 mmol/L (ref 134–144)
eGFR: 79 mL/min/{1.73_m2} (ref >59)

## 2021-07-27 LAB — MICROALBUMIN / CREATININE URINE RATIO
Creatinine, Urine: 122.1 mg/dL
Microalb/Creat Ratio: 3 mg/g creat (ref 0–29)
Microalbumin, Urine: 3.6 ug/mL

## 2021-07-27 LAB — HIV ANTIBODY (ROUTINE TESTING W REFLEX): HIV Screen 4th Generation wRfx: NONREACTIVE

## 2021-07-27 LAB — HEPATITIS C ANTIBODY: Hep C Virus Ab: <0.1 s/co ratio (ref 0.0–0.9)

## 2021-07-31 MED ORDER — ROSUVASTATIN CALCIUM 20 MG PO TABS: 20.0000 mg | ORAL_TABLET | Freq: Every day | ORAL | 0 refills | Status: DC

## 2021-07-31 NOTE — Addendum Note (Signed)
Addended by: Madelin Rear on: 07/31/2021 04:19 PM   Modules accepted: Orders

## 2021-08-07 ENCOUNTER — Encounter: Payer: Self-pay | Admitting: Internal Medicine

## 2021-08-09 ENCOUNTER — Encounter: Payer: Self-pay | Admitting: *Deleted

## 2021-09-29 ENCOUNTER — Other Ambulatory Visit: Payer: Self-pay | Admitting: Thoracic Surgery (Cardiothoracic Vascular Surgery)

## 2021-10-04 ENCOUNTER — Encounter: Payer: Self-pay | Admitting: Internal Medicine

## 2021-10-04 ENCOUNTER — Ambulatory Visit: Payer: 59 | Admitting: Internal Medicine

## 2021-10-04 VITALS — BP 120/80 | HR 76 | Ht 69.0 in | Wt 205.4 lb

## 2021-10-04 DIAGNOSIS — R1319 Other dysphagia: Secondary | ICD-10-CM

## 2021-10-04 DIAGNOSIS — Z1211 Encounter for screening for malignant neoplasm of colon: Secondary | ICD-10-CM

## 2021-10-04 NOTE — Progress Notes (Signed)
Patient ID: Aaron HENDERSHOTT, male   DOB: 09/17/1965, 56 y.o.   MRN: [id number] HPI: Aaron Griffin is a 56 year old male with a history of pulmonary bleb status post wedge resection, sleep apnea who is seen in consult at the request of Dr. Wolfgang Griffin to evaluate intermittent esophageal dysphagia and consider colorectal cancer screening.  He is here alone today.  He reports that over the last few years he has had intermittent solid food dysphagia.  This was occurring on a somewhat regular basis prior to his diet change.  He has been eating low carbohydrate diet and since doing so has infrequent solid food dysphagia.  Typically only with meats.  Now he is able to force this or push this down with water when it occurs.  Previously he would have to leave the table and vomit up food that would not clear the esophagus.  He does not have heartburn.  No odynophagia.  No abdominal pain.  No nausea vomiting.  Regular bowel movements.  No blood in stool or melena.  No family history of GI tract malignancy.  Previous tobacco use but none in 7 years. He works in the [employer] for [employer]  No prior colonoscopy  Past Medical History:  Diagnosis Date   Bulla of lung ([place])    Emphysema lung ([place])    Intercostal neuralgia    Sleep apnea    cpap    Past Surgical History:  Procedure Laterality Date   EXCISION OF BULLA Right 05/18/2020   Procedure: BULLECTOMY RESECTION OF APICAL BLEBS;  Surgeon: Aaron Nakayama, MD;  Location: [facility];  Service: Thoracic;  Laterality: Right;   INTERCOSTAL NERVE BLOCK Right 05/18/2020   Procedure: INTERCOSTAL NERVE BLOCK;  Surgeon: Aaron Nakayama, MD;  Location: [facility];  Service: Thoracic;  Laterality: Right;   WISDOM TOOTH EXTRACTION      Outpatient Medications Prior to Visit  Medication Sig Dispense Refill   diphenhydrAMINE HCl 50 MG/30ML LIQD Take 50 mg by mouth at bedtime.      gabapentin (NEURONTIN) 300 MG capsule TAKE 1 CAPSULE(300 MG) BY MOUTH THREE TIMES DAILY  90 capsule 5   rosuvastatin (CRESTOR) 20 MG tablet Take 1 tablet (20 mg total) by mouth daily. 90 tablet 0   No facility-administered medications prior to visit.    No Known Allergies  Family History  Problem Relation Age of Onset   Diabetes Mother    Diabetes Father    Colon cancer Neg Hx    Colon polyps Neg Hx    Esophageal cancer Neg Hx    Rectal cancer Neg Hx    Stomach cancer Neg Hx     Social History   Tobacco Use   Smoking status: Former    Packs/day: 1.50    Years: 30.00    Pack years: 45.00    Types: Cigarettes    Quit date: 03/06/2015    Years since quitting: 6.5   Smokeless tobacco: Never  Vaping Use   Vaping Use: Never used  Substance Use Topics   Alcohol use: No    Alcohol/week: 0.0 standard drinks   Drug use: No    ROS: As per history of present illness, otherwise negative  BP 120/80    Pulse 76    Ht 5\' 9"  (1.753 m)    Wt 205 lb 6 oz (93.2 kg)    BMI 30.33 kg/m  Gen: awake, alert, NAD HEENT: anicteric, op clear CV: RRR, no mrg Pulm: CTA b/l Abd: soft,  NT/ND, +BS throughout Ext: no c/c/e Neuro: nonfocal   RELEVANT LABS AND IMAGING: CBC    Component Value Date/Time   WBC 9.5 07/13/2020 1002   WBC 12.4 (H) 05/20/2020 0107   RBC 4.49 07/13/2020 1002   RBC 3.78 (L) 05/20/2020 0107   HGB 13.7 07/13/2020 1002   HCT 41.1 07/13/2020 1002   PLT 290 07/13/2020 1002   MCV 92 07/13/2020 1002   MCH 30.5 07/13/2020 1002   MCH 31.0 05/20/2020 0107   MCHC 33.3 07/13/2020 1002   MCHC 33.1 05/20/2020 0107   RDW 12.9 07/13/2020 1002   LYMPHSABS 3.6 (H) 07/13/2020 1002   MONOABS 1.2 (H) 05/19/2012 1145   EOSABS 0.3 07/13/2020 1002   BASOSABS 0.1 07/13/2020 1002    CMP     Component Value Date/Time   NA 141 07/26/2021 0804   K 4.2 07/26/2021 0804   CL 102 07/26/2021 0804   CO2 21 07/26/2021 0804   GLUCOSE 112 (H) 07/26/2021 0804   GLUCOSE 141 (H) 05/21/2020 0034   BUN 21 07/26/2021 0804   CREATININE 1.12 07/26/2021 0804   CALCIUM 9.4  07/26/2021 0804   PROT 7.9 07/26/2021 0804   ALBUMIN 4.7 07/26/2021 0804   AST 27 07/26/2021 0804   ALT 35 07/26/2021 0804   ALKPHOS 60 07/26/2021 0804   BILITOT 0.4 07/26/2021 0804   GFRNONAA 82 07/13/2020 1002   GFRNONAA >60 05/21/2020 0034   GFRAA 95 07/13/2020 1002    ASSESSMENT/PLAN: 56 year old male with a history of pulmonary bleb status post wedge resection, sleep apnea who is seen in consult at the request of Dr. Wolfgang Griffin to evaluate intermittent esophageal dysphagia and consider colorectal cancer screening.   Solid food dysphagia --intermittent, better with low carbohydrate diet.  Suspicion for stricture and possibly GERD which has improved with change in diet.  I recommended upper endoscopy to exclude esophagitis, Barrett's esophagus and consider dilation should stenosis or esophageal ring be found.  In the absence of heartburn I am not going to start PPI at this time. --EGD in the [facility]; we reviewed the risk, benefits and alternatives and he is agreeable and wishes to proceed.  2.  Colon cancer screening --no prior colonoscopy.  I recommended colonoscopy.  We reviewed the risk, benefits and alternatives and he is agreeable and wishes to proceed --Colonoscopy in the [facility]   AG:TXMIWO, Aaron Griffin, [place] [address] [place],  [place] [address]

## 2021-10-04 NOTE — Patient Instructions (Addendum)
If you are age 54 or older, your body mass index should be between 23-30. Your Body mass index is 30.33 kg/m. If this is out of the aforementioned range listed, please consider follow up with your Primary Care Provider.  If you are age 33 or younger, your body mass index should be between 19-25. Your Body mass index is 30.33 kg/m. If this is out of the aformentioned range listed, please consider follow up with your Primary Care Provider.   ________________________________________________________  The [facility] providers would like to encourage you to use [facility] to communicate with providers for non-urgent requests or questions.  Due to long hold times on the telephone, sending your provider a message by [facility] may be a faster and more efficient way to get a response.  Please allow 48 business hours for a response.  Please remember that this is for non-urgent requests.  _______________________________________________________  Aaron Griffin have been scheduled for an endoscopy and colonoscopy on 10-24-21. Please follow the written instructions given to you at your visit today. Please pick up your prep supplies at the pharmacy within the next 1-3 days. If you use inhalers (even only as needed), please bring them with you on the day of your procedure.  Thank you for entrusting me with your care and for choosing [facility], Dr. Zenovia Jarred

## 2021-10-09 ENCOUNTER — Other Ambulatory Visit: Payer: Self-pay

## 2021-10-09 ENCOUNTER — Ambulatory Visit: Payer: 59 | Admitting: Thoracic Surgery (Cardiothoracic Vascular Surgery)

## 2021-10-09 VITALS — BP 113/73 | HR 79 | Resp 20 | Ht 69.0 in | Wt 205.0 lb

## 2021-10-09 DIAGNOSIS — Z09 Encounter for follow-up examination after completed treatment for conditions other than malignant neoplasm: Secondary | ICD-10-CM | POA: Diagnosis not present

## 2021-10-09 NOTE — Progress Notes (Signed)
Aaron Griffin,Aaron Griffin [id number]             [phone]    HPI: Aaron Griffin returns for follow-up after resection of a giant bulla of the right lung.    Aaron Griffin is a 56 year old man with a history of tobacco abuse, sleep apnea, aortic atherosclerosis, and a giant bulla of the right lung.  He initially presented with infection of the bulla back in 2021.  Did a robotic assisted resection on 05/18/2020.  He had a lot of neuropathic pain postoperatively.  At 1 time he was on 600 mg of gabapentin 3 times daily.  From most the past year has been on 300 mg 3 times a day.  He says he continues to have pain.  It is under control with the gabapentin, but if he misses a dose or 2 he will start having more discomfort.  He able to play golf and do the activities he wants to as long as he is on the medication.  No shortness of breath or respiratory issues.  Past Medical History:  Diagnosis Date   Bulla of lung (HCC)    Emphysema lung (HCC)    Intercostal neuralgia    Sleep apnea    cpap    Current Outpatient Medications  Medication Sig Dispense Refill   diphenhydrAMINE HCl 50 MG/30ML LIQD Take 50 mg by mouth at bedtime.      gabapentin (NEURONTIN) 300 MG capsule TAKE 1 CAPSULE(300 MG) BY MOUTH THREE TIMES DAILY 90 capsule 5   rosuvastatin (CRESTOR) 20 MG tablet Take 1 tablet (20 mg total) by mouth daily. 90 tablet 0   No current facility-administered medications for this visit.    Physical Exam BP 113/73 (BP Location: Right Arm, Patient Position: Sitting)    Pulse 79    Resp 20    Ht 5\' 9"  (1.753 m)    Wt 205 lb (93 kg)    SpO2 93% Comment: RA   BMI 30.25 kg/m  56 year old man in no acute distress Alert and oriented x3 with no focal deficits Lungs clear with equal breath sounds bilaterally Incisions well-healed  Diagnostic Tests: CHEST - 2 VIEW   COMPARISON:  Multiple prior chest x-rays including 05/16/2020 and 09/04/2019.   FINDINGS: Scarring  at the right apex volume loss is stable. No pneumothorax is present. Lungs are otherwise clear. Heart size is normal.   IMPRESSION: 1. Stable scarring at the right apex. 2. No acute cardiopulmonary disease.     Electronically Signed   By: San Morelle M.D.   On: 10/03/2020 11:36 I personally reviewed the chest x-ray.  There is postoperative change at the apex.  Impression: Aaron Griffin is a 56 year old man with a history of tobacco abuse, sleep apnea, aortic atherosclerosis, and a giant bulla of the right lung.  He underwent robotic resection of the bulla in October 2021.  He has had issues with intercostal neuralgia since surgery.  Tobacco abuse-quit smoking in 2016  Intercostal neuralgia-well-controlled with gabapentin 300 mg 3 times daily.  He can tell if he misses even 1 or 2 doses, so he needs to continue on that medication.  He would prefer just to handle that through Dr. Lance Sell office.  Plan: Follow-up with Dr. Wolfgang Phoenix.  I will be happy to see Aaron Griffin back at anytime in the future if I can be of any further assistance with his care  Melrose Nakayama,  MD [facility] [phone]

## 2021-10-19 ENCOUNTER — Encounter: Payer: Self-pay | Admitting: Internal Medicine

## 2021-10-24 ENCOUNTER — Other Ambulatory Visit: Payer: Self-pay | Admitting: Internal Medicine

## 2021-10-24 ENCOUNTER — Encounter: Payer: Self-pay | Admitting: Internal Medicine

## 2021-10-24 ENCOUNTER — Ambulatory Visit (AMBULATORY_SURGERY_CENTER): Payer: 59 | Admitting: Internal Medicine

## 2021-10-24 VITALS — BP 93/60 | HR 65 | Temp 98.2°F | Resp 14 | Ht 69.0 in | Wt 205.0 lb

## 2021-10-24 DIAGNOSIS — K635 Polyp of colon: Secondary | ICD-10-CM | POA: Diagnosis not present

## 2021-10-24 DIAGNOSIS — K222 Esophageal obstruction: Secondary | ICD-10-CM

## 2021-10-24 DIAGNOSIS — D120 Benign neoplasm of cecum: Secondary | ICD-10-CM

## 2021-10-24 DIAGNOSIS — K2100 Gastro-esophageal reflux disease with esophagitis, without bleeding: Secondary | ICD-10-CM

## 2021-10-24 DIAGNOSIS — D125 Benign neoplasm of sigmoid colon: Secondary | ICD-10-CM | POA: Diagnosis not present

## 2021-10-24 DIAGNOSIS — R1319 Other dysphagia: Secondary | ICD-10-CM | POA: Diagnosis not present

## 2021-10-24 DIAGNOSIS — Z1211 Encounter for screening for malignant neoplasm of colon: Secondary | ICD-10-CM | POA: Diagnosis not present

## 2021-10-24 MED ORDER — SODIUM CHLORIDE 0.9 % IV SOLN: 500.0000 mL | Freq: Once | INTRAVENOUS | Status: DC

## 2021-10-24 MED ORDER — PANTOPRAZOLE SODIUM 40 MG PO TBEC: 40.0000 mg | DELAYED_RELEASE_TABLET | Freq: Every day | ORAL | 2 refills | Status: DC

## 2021-10-24 NOTE — Progress Notes (Signed)
Called to room to assist during endoscopic procedure.  Patient ID and intended procedure confirmed with present staff. Received instructions for my participation in the procedure from the performing physician.  

## 2021-10-24 NOTE — Op Note (Signed)
[place] ?Patient Name: Aaron Griffin ?Procedure Date: 10/24/2021 3:28 PM ?MRN: [id number] ?Endoscopist: Jerene Bears , MD ?Age: 56 ?Referring MD:  ?Date of Birth: [birth year] ?Gender: Male ?Account #: [id number] ?Procedure:                Upper GI endoscopy ?Indications:              Dysphagia ?Medicines:                Propofol per Anesthesia ?Procedure:                Pre-Anesthesia Assessment: ?                          - Prior to the procedure, a History and Physical  ?                          was performed, and patient medications and  ?                          allergies were reviewed. The patient's tolerance of  ?                          previous anesthesia was also reviewed. The risks  ?                          and benefits of the procedure and the sedation  ?                          options and risks were discussed with the patient.  ?                          All questions were answered, and informed consent  ?                          was obtained. Prior Anticoagulants: The patient has  ?                          taken no previous anticoagulant or antiplatelet  ?                          agents. ASA Grade Assessment: II - A patient with  ?                          mild systemic disease. After reviewing the risks  ?                          and benefits, the patient was deemed in  ?                          satisfactory condition to undergo the procedure. ?                          After obtaining informed consent, the endoscope was  ?  passed under direct vision. Throughout the  ?                          procedure, the patient's blood pressure, pulse, and  ?                          oxygen saturations were monitored continuously. The  ?                          Endoscope was introduced through the mouth, and  ?                          advanced to the second part of duodenum. The upper  ?                          GI endoscopy was accomplished without difficulty.  ?                           The patient tolerated the procedure well. ?Scope In: ?Scope Out: ?Findings:                 LA Grade A (one or more mucosal breaks less than 5  ?                          mm, not extending between tops of 2 mucosal folds)  ?                          esophagitis with no bleeding was found at the  ?                          gastroesophageal junction. ?                          One benign-appearing, intrinsic moderate  ?                          (circumferential scarring or stenosis; an endoscope  ?                          may pass) stenosis was found at the  ?                          gastroesophageal junction. This stenosis measured  ?                          1.3 cm (inner diameter) x less than one cm (in  ?                          length). The stenosis was traversed. A TTS dilator  ?                          was passed through the scope. Dilation with a  ?  16-17-18 mm balloon dilator was performed to 18 mm.  ?                          The dilation site was examined and showed mild  ?                          mucosal disruption. ?                          The entire examined stomach was normal. ?                          The examined duodenum was normal. ?Complications:            No immediate complications. ?Estimated Blood Loss:     Estimated blood loss was minimal. ?Impression:               - LA Grade A reflux esophagitis with no bleeding. ?                          - Benign-appearing esophageal stenosis. Dilated to  ?                          18 mm. ?                          - Normal stomach. ?                          - Normal examined duodenum. ?                          - No specimens collected. ?Recommendation:           - Patient has a contact number available for  ?                          emergencies. The signs and symptoms of potential  ?                          delayed complications were discussed with the  ?                          patient.  Return to normal activities tomorrow.  ?                          Written discharge instructions were provided to the  ?                          patient. ?                          - Resume previous diet. ?                          - Continue present medications. ?                          -  Add pantoprazole 40 mg once daily. ?Jerene Bears, MD ?10/24/2021 4:16:34 PM ?This report has been signed electronically. ?

## 2021-10-24 NOTE — Patient Instructions (Signed)
Please read handouts provided. ?Continue present medications. ?Await pathology results. ?Begin pantoprazole 40 mg daily. ?No aspirin, ibuprofen, naproxen, or other non-steriodal anti-inflammatory drugs for 2 weeks. ?Resume previous diet. ? ? ? ?YOU HAD AN ENDOSCOPIC PROCEDURE TODAY AT [facility]:   Refer to the procedure report that was given to you for any specific questions about what was found during the examination.  If the procedure report does not answer your questions, please call your gastroenterologist to clarify.  If you requested that your care partner not be given the details of your procedure findings, then the procedure report has been included in a sealed envelope for you to review at your convenience later. ? ?YOU SHOULD EXPECT: Some feelings of bloating in the abdomen. Passage of more gas than usual.  Walking can help get rid of the air that was put into your GI tract during the procedure and reduce the bloating. If you had a lower endoscopy (such as a colonoscopy or flexible sigmoidoscopy) you may notice spotting of blood in your stool or on the toilet paper. If you underwent a bowel prep for your procedure, you may not have a normal bowel movement for a few days. ? ?Please Note:  You might notice some irritation and congestion in your nose or some drainage.  This is from the oxygen used during your procedure.  There is no need for concern and it should clear up in a day or so. ? ?SYMPTOMS TO REPORT IMMEDIATELY: ? ?Following lower endoscopy (colonoscopy or flexible sigmoidoscopy): ? Excessive amounts of blood in the stool ? Significant tenderness or worsening of abdominal pains ? Swelling of the abdomen that is new, acute ? Fever of 100?F or higher ? ?Following upper endoscopy (EGD) ? Vomiting of blood or coffee ground material ? New chest pain or pain under the shoulder blades ? Painful or persistently difficult swallowing ? New shortness of breath ? Fever of 100?F or  higher ? Black, tarry-looking stools ? ?For urgent or emergent issues, a gastroenterologist can be reached at any hour by calling [phone]. ?Do not use MyChart messaging for urgent concerns.  ? ? ?DIET:  We do recommend a small meal at first, but then you may proceed to your regular diet.  Drink plenty of fluids but you should avoid alcoholic beverages for 24 hours. ? ?ACTIVITY:  You should plan to take it easy for the rest of today and you should NOT DRIVE or use heavy machinery until tomorrow (because of the sedation medicines used during the test).   ? ?FOLLOW UP: ?Our staff will call the number listed on your records 48-72 hours following your procedure to check on you and address any questions or concerns that you may have regarding the information given to you following your procedure. If we do not reach you, we will leave a message.  We will attempt to reach you two times.  During this call, we will ask if you have developed any symptoms of COVID 19. If you develop any symptoms (ie: fever, flu-like symptoms, shortness of breath, cough etc.) before then, please call [phone].  If you test positive for Covid 19 in the 2 weeks post procedure, please call and report this information to [place].   ? ?If any biopsies were taken you will be contacted by phone or by letter within the next 1-3 weeks.  Please call us at [phone] if you have not heard about the biopsies in 3 weeks.  ? ? ?  SIGNATURES/CONFIDENTIALITY: ?You and/or your care partner have signed paperwork which will be entered into your electronic medical record.  These signatures attest to the fact that that the information above on your After Visit Summary has been reviewed and is understood.  Full responsibility of the confidentiality of this discharge information lies with you and/or your care-partner.  ?

## 2021-10-24 NOTE — Progress Notes (Signed)
Pt's states no medical or surgical changes since previsit or office visit. VS assessed by D.T 

## 2021-10-24 NOTE — Progress Notes (Signed)
Report to PACU, RN, vss, BBS= Clear.  

## 2021-10-24 NOTE — Progress Notes (Signed)
See office note dated 10/04/2021 for details ? ?Patient presents for upper endoscopy and colonoscopy ? ?He remains appropriate for both procedures in the [facility] today ?

## 2021-10-24 NOTE — Op Note (Signed)
[place] ?Patient Name: Aaron Griffin ?Procedure Date: 10/24/2021 3:28 PM ?MRN: [id number] ?Endoscopist: Jerene Bears , MD ?Age: 56 ?Referring MD:  ?Date of Birth: Oct 12, 1965 ?Gender: Male ?Account #: [id number] ?Procedure:                Colonoscopy ?Indications:              Screening for colorectal malignant neoplasm, This  ?                          is the patient's first colonoscopy ?Medicines:                Monitored Anesthesia Care ?Procedure:                Pre-Anesthesia Assessment: ?                          - Prior to the procedure, a History and Physical  ?                          was performed, and patient medications and  ?                          allergies were reviewed. The patient's tolerance of  ?                          previous anesthesia was also reviewed. The risks  ?                          and benefits of the procedure and the sedation  ?                          options and risks were discussed with the patient.  ?                          All questions were answered, and informed consent  ?                          was obtained. Prior Anticoagulants: The patient has  ?                          taken no previous anticoagulant or antiplatelet  ?                          agents. ASA Grade Assessment: II - A patient with  ?                          mild systemic disease. After reviewing the risks  ?                          and benefits, the patient was deemed in  ?                          satisfactory condition to undergo the procedure. ?  After obtaining informed consent, the colonoscope  ?                          was passed under direct vision. Throughout the  ?                          procedure, the patient's blood pressure, pulse, and  ?                          oxygen saturations were monitored continuously. The  ?                          Olympus CF-[id number] (#[id number]) Colonoscope was  ?                          introduced through the anus and  advanced to the  ?                          cecum, identified by appendiceal orifice and  ?                          ileocecal valve. The colonoscopy was performed  ?                          without difficulty. The patient tolerated the  ?                          procedure well. The quality of the bowel  ?                          preparation was good. The ileocecal valve,  ?                          appendiceal orifice, and rectum were photographed. ?Scope In: 3:55:09 PM ?Scope Out: 4:11:24 PM ?Scope Withdrawal Time: 0 hours 13 minutes 31 seconds  ?Total Procedure Duration: 0 hours 16 minutes 15 seconds  ?Findings:                 The digital rectal exam was normal. ?                          A 3 mm polyp was found in the ileocecal valve. The  ?                          polyp was sessile. The polyp was removed with a  ?                          cold biopsy forceps. Resection and retrieval were  ?                          complete. ?                          A 10 mm polyp was found in the sigmoid colon. The  ?  polyp was pedunculated. The polyp was removed with  ?                          a hot snare. Resection and retrieval were complete. ?                          Multiple small and large-mouthed diverticula were  ?                          found in the sigmoid colon, descending colon and  ?                          hepatic flexure. ?                          The retroflexed view of the distal rectum and anal  ?                          verge was normal and showed no anal or rectal  ?                          abnormalities. ?Complications:            No immediate complications. ?Estimated Blood Loss:     Estimated blood loss: none. ?Impression:               - One 3 mm polyp at the ileocecal valve, removed  ?                          with a cold biopsy forceps. Resected and retrieved. ?                          - One 10 mm polyp in the sigmoid colon, removed  ?                           with a hot snare. Resected and retrieved. ?                          - Diverticulosis in the sigmoid colon, in the  ?                          descending colon and at the hepatic flexure. ?Recommendation:           - Patient has a contact number available for  ?                          emergencies. The signs and symptoms of potential  ?                          delayed complications were discussed with the  ?                          patient. Return to normal activities tomorrow.  ?  Written discharge instructions were provided to the  ?                          patient. ?                          - Resume previous diet. ?                          - Continue present medications. ?                          - No aspirin, ibuprofen, naproxen, or other  ?                          non-steroidal anti-inflammatory drugs for 2 weeks  ?                          after polyp removal. ?                          - Await pathology results. ?                          - Repeat colonoscopy is recommended for  ?                          surveillance. The colonoscopy date will be  ?                          determined after pathology results from today's  ?                          exam become available for review. ?Jerene Bears, MD ?10/24/2021 4:20:03 PM ?This report has been signed electronically. ?

## 2021-10-26 ENCOUNTER — Telehealth: Payer: Self-pay

## 2021-10-26 NOTE — Telephone Encounter (Signed)
?  Follow up Call- ? ?Call back number 10/24/2021  ?Post procedure Call Back phone  # [phone]  ?Permission to leave phone message Yes  ?Some recent data might be hidden  ?  ? ?Patient questions: ? ?Do you have a fever, pain , or abdominal swelling? No. ?Pain Score  0 * ? ?Have you tolerated food without any problems? Yes.   ? ?Have you been able to return to your normal activities? Yes.   ? ?Do you have any questions about your discharge instructions: ?Diet   No. ?Medications  No. ?Follow up visit  No. ? ?Do you have questions or concerns about your Care? No. ? ?Actions: ?* If pain score is 4 or above: ?No action needed, pain <4. ? ? ?

## 2021-10-31 ENCOUNTER — Encounter: Payer: Self-pay | Admitting: Internal Medicine

## 2021-11-05 ENCOUNTER — Other Ambulatory Visit: Payer: Self-pay | Admitting: Family Medicine

## 2022-01-24 ENCOUNTER — Ambulatory Visit: Payer: 59 | Admitting: Family Medicine

## 2022-03-05 ENCOUNTER — Other Ambulatory Visit: Payer: Self-pay | Admitting: *Deleted

## 2022-03-05 DIAGNOSIS — R7303 Prediabetes: Secondary | ICD-10-CM

## 2022-03-05 DIAGNOSIS — Z125 Encounter for screening for malignant neoplasm of prostate: Secondary | ICD-10-CM

## 2022-03-05 DIAGNOSIS — Z79899 Other long term (current) drug therapy: Secondary | ICD-10-CM

## 2022-03-05 DIAGNOSIS — E785 Hyperlipidemia, unspecified: Secondary | ICD-10-CM

## 2022-03-08 ENCOUNTER — Encounter: Payer: Self-pay | Admitting: Family Medicine

## 2022-03-08 ENCOUNTER — Ambulatory Visit (INDEPENDENT_AMBULATORY_CARE_PROVIDER_SITE_OTHER): Payer: 59 | Admitting: Family Medicine

## 2022-03-08 VITALS — BP 109/72 | HR 75 | Temp 98.2°F | Wt 201.6 lb

## 2022-03-08 DIAGNOSIS — R748 Abnormal levels of other serum enzymes: Secondary | ICD-10-CM

## 2022-03-08 DIAGNOSIS — M792 Neuralgia and neuritis, unspecified: Secondary | ICD-10-CM

## 2022-03-08 DIAGNOSIS — G4733 Obstructive sleep apnea (adult) (pediatric): Secondary | ICD-10-CM

## 2022-03-08 DIAGNOSIS — E7849 Other hyperlipidemia: Secondary | ICD-10-CM

## 2022-03-08 LAB — BASIC METABOLIC PANEL
BUN/Creatinine Ratio: 21 — ABNORMAL HIGH (ref 9–20)
BUN: 22 mg/dL (ref 6–24)
CO2: 23 mmol/L (ref 20–29)
Calcium: 9.7 mg/dL (ref 8.7–10.2)
Chloride: 101 mmol/L (ref 96–106)
Creatinine, Ser: 1.05 mg/dL (ref 0.76–1.27)
Glucose: 108 mg/dL — ABNORMAL HIGH (ref 70–99)
Potassium: 4.5 mmol/L (ref 3.5–5.2)
Sodium: 138 mmol/L (ref 134–144)
eGFR: 85 mL/min/{1.73_m2} (ref >59)

## 2022-03-08 LAB — HEPATIC FUNCTION PANEL
ALT: 74 IU/L — ABNORMAL HIGH (ref 0–44)
AST: 47 IU/L — ABNORMAL HIGH (ref 0–40)
Albumin: 4.9 g/dL (ref 3.8–4.9)
Alkaline Phosphatase: 58 IU/L (ref 44–121)
Bilirubin Total: 0.4 mg/dL (ref 0.0–1.2)
Bilirubin, Direct: 0.11 mg/dL (ref 0.00–0.40)
Total Protein: 7.3 g/dL (ref 6.0–8.5)

## 2022-03-08 LAB — LIPID PANEL
Chol/HDL Ratio: 3.3 ratio (ref 0.0–5.0)
Cholesterol, Total: 160 mg/dL (ref 100–199)
HDL: 49 mg/dL (ref >39)
LDL Chol Calc (NIH): 94 mg/dL (ref 0–99)
Triglycerides: 93 mg/dL (ref 0–149)
VLDL Cholesterol Cal: 17 mg/dL (ref 5–40)

## 2022-03-08 LAB — HEMOGLOBIN A1C
Est. average glucose Bld gHb Est-mCnc: 131 mg/dL
Hgb A1c MFr Bld: 6.2 % — ABNORMAL HIGH (ref 4.8–5.6)

## 2022-03-08 LAB — PSA: Prostate Specific Ag, Serum: 0.5 ng/mL (ref 0.0–4.0)

## 2022-03-08 MED ORDER — GABAPENTIN 300 MG PO CAPS
ORAL_CAPSULE | ORAL | 1 refills | Status: DC
Start: 2022-03-08 — End: 2022-11-14

## 2022-03-08 NOTE — Patient Instructions (Signed)

## 2022-03-08 NOTE — Progress Notes (Unsigned)
   Subjective:    Patient ID: Aaron Griffin, male    DOB: 1966-06-27, 56 y.o.   MRN: [id number]  HPI Pt arrives for follow up. Pt states he is needing new CPAP machine. Pt states it has been over 5 years. Pt is getting CPAP through [facility].  Patient does have sleep apnea States machine no longer working properly Having a lot of problems with maintaining pressure He benefits from the machine use and uses it on a regular basis.  Without it he has a hard time functioning. MAD it is a regimen up to 0 okay all okay pt had surgery about 2 years ago and specialist placed him on Gabapentin 300 mg TID; pt would like PCP to over look this.  Other hyperlipidemia  Obstructive sleep apnea  Elevated liver enzymes - Plan: Hepatic function panel  Neuropathic pain   Review of Systems     Objective:   Physical Exam  General-in no acute distress Eyes-no discharge Lungs-respiratory rate normal, CTA CV-no murmurs,RRR Extremities skin warm dry no edema Neuro grossly normal Behavior normal, alert       Assessment & Plan:   1. Other hyperlipidemia Stop cholesterol medicine for the next 6 weeks then check liver profile it is possible elevated liver enzyme related to the increased dose of Crestor  2. Obstructive sleep apnea He states he needs a new machine we will try to get this through [facility]  3. Elevated liver enzymes Liver function in 6 weeks - Hepatic function panel  4. Neuropathic pain Gabapentin may continue refills given

## 2022-03-18 ENCOUNTER — Telehealth: Payer: Self-pay | Admitting: *Deleted

## 2022-03-18 NOTE — Telephone Encounter (Signed)
Done - thank you.

## 2022-03-18 NOTE — Telephone Encounter (Signed)
Script faxed to provided fax number.

## 2022-03-18 NOTE — Telephone Encounter (Signed)
[facility] and [facility] do not accept his insurance for CPAP- must get thru [employer] [phone] Fax # [phone]

## 2022-03-18 NOTE — Telephone Encounter (Signed)
CPAP script written out and placed on provider door for signature. Will then fax.

## 2022-06-07 ENCOUNTER — Encounter: Payer: Self-pay | Admitting: Family Medicine

## 2022-06-07 LAB — HEPATIC FUNCTION PANEL
ALT: 24 IU/L (ref 0–44)
AST: 18 IU/L (ref 0–40)
Albumin: 4.7 g/dL (ref 3.8–4.9)
Alkaline Phosphatase: 70 IU/L (ref 44–121)
Bilirubin Total: 0.3 mg/dL (ref 0.0–1.2)
Bilirubin, Direct: <0.1 mg/dL (ref 0.00–0.40)
Total Protein: 7.4 g/dL (ref 6.0–8.5)

## 2022-10-14 ENCOUNTER — Other Ambulatory Visit: Payer: Self-pay | Admitting: Thoracic Surgery (Cardiothoracic Vascular Surgery)

## 2022-11-07 ENCOUNTER — Ambulatory Visit (INDEPENDENT_AMBULATORY_CARE_PROVIDER_SITE_OTHER): Payer: 59 | Admitting: Family Medicine

## 2022-11-07 ENCOUNTER — Encounter: Payer: Self-pay | Admitting: Family Medicine

## 2022-11-07 VITALS — BP 114/68 | HR 76 | Ht 69.0 in | Wt 210.6 lb

## 2022-11-07 DIAGNOSIS — E7849 Other hyperlipidemia: Secondary | ICD-10-CM | POA: Diagnosis not present

## 2022-11-07 DIAGNOSIS — J449 Chronic obstructive pulmonary disease, unspecified: Secondary | ICD-10-CM | POA: Insufficient documentation

## 2022-11-07 DIAGNOSIS — R7303 Prediabetes: Secondary | ICD-10-CM

## 2022-11-07 DIAGNOSIS — I700 Atherosclerosis of aorta: Secondary | ICD-10-CM

## 2022-11-07 NOTE — Progress Notes (Signed)
   Subjective:    Patient ID: Aaron Griffin, male    DOB: November 27, 1965, 57 y.o.   MRN: [id number]  HPI Patient arrives today for medication follow up. Other hyperlipidemia - Plan: Lipid panel, CANCELED: Lipid panel  Prediabetes - Plan: Hemoglobin A1c, Hepatic Function Panel, CANCELED: Hemoglobin A1c, CANCELED: Hepatic Function Panel  Aortic atherosclerosis (HCC)  Chronic obstructive pulmonary disease, unspecified COPD type ([place]) He is working to [employer] exercise watching portions and diet trying to lose some weight History of aortic atherosclerosis but did not tolerate statins Some OTC measures with better diet he is not interested in starting statins currently Previous scan showed COPD Also has prediabetes Trying to watch sugars in his diet    Review of Systems     Objective:   Physical Exam General-in no acute distress Eyes-no discharge Lungs-respiratory rate normal, CTA CV-no murmurs,RRR Extremities skin warm dry no edema Neuro grossly normal Behavior normal, alert        Assessment & Plan:  Obesity-portion control regular physical activity can get back into the gym this should help Goal is to get LDL below 70.  Patient did not tolerate statin had elevated liver enzymes Will recheck labs Possibly will try statin again at a lower dose Lung cancer screening discussed he will think about it and let us know Follow-up within 6 months

## 2022-11-08 LAB — HEPATIC FUNCTION PANEL
ALT: 34 IU/L (ref 0–44)
AST: 26 IU/L (ref 0–40)
Albumin: 4.9 g/dL (ref 3.8–4.9)
Alkaline Phosphatase: 71 IU/L (ref 44–121)
Bilirubin Total: 0.5 mg/dL (ref 0.0–1.2)
Bilirubin, Direct: <0.1 mg/dL (ref 0.00–0.40)
Total Protein: 8.3 g/dL (ref 6.0–8.5)

## 2022-11-08 LAB — LIPID PANEL
Chol/HDL Ratio: 5.8 ratio — ABNORMAL HIGH (ref 0.0–5.0)
Cholesterol, Total: 261 mg/dL — ABNORMAL HIGH (ref 100–199)
HDL: 45 mg/dL (ref >39)
LDL Chol Calc (NIH): 174 mg/dL — ABNORMAL HIGH (ref 0–99)
Triglycerides: 224 mg/dL — ABNORMAL HIGH (ref 0–149)
VLDL Cholesterol Cal: 42 mg/dL — ABNORMAL HIGH (ref 5–40)

## 2022-11-08 LAB — HEMOGLOBIN A1C
Est. average glucose Bld gHb Est-mCnc: 131 mg/dL
Hgb A1c MFr Bld: 6.2 % — ABNORMAL HIGH (ref 4.8–5.6)

## 2022-11-14 MED ORDER — GABAPENTIN 300 MG PO CAPS: ORAL_CAPSULE | ORAL | 1 refills | Status: DC

## 2022-11-14 NOTE — Addendum Note (Signed)
Addended by: Dairl Ponder on: 11/14/2022 03:03 PM   Modules accepted: Orders

## 2023-03-27 ENCOUNTER — Ambulatory Visit (INDEPENDENT_AMBULATORY_CARE_PROVIDER_SITE_OTHER): Payer: Managed Care, Other (non HMO) | Admitting: Family Medicine

## 2023-03-27 ENCOUNTER — Encounter: Payer: Self-pay | Admitting: Family Medicine

## 2023-03-27 VITALS — BP 110/71 | HR 84 | Temp 97.3°F | Ht 69.0 in | Wt 217.0 lb

## 2023-03-27 DIAGNOSIS — Z125 Encounter for screening for malignant neoplasm of prostate: Secondary | ICD-10-CM | POA: Diagnosis not present

## 2023-03-27 DIAGNOSIS — Z0000 Encounter for general adult medical examination without abnormal findings: Secondary | ICD-10-CM

## 2023-03-27 DIAGNOSIS — E7849 Other hyperlipidemia: Secondary | ICD-10-CM

## 2023-03-27 DIAGNOSIS — R7303 Prediabetes: Secondary | ICD-10-CM | POA: Diagnosis not present

## 2023-03-27 DIAGNOSIS — R748 Abnormal levels of other serum enzymes: Secondary | ICD-10-CM

## 2023-03-27 DIAGNOSIS — Z0001 Encounter for general adult medical examination with abnormal findings: Secondary | ICD-10-CM | POA: Diagnosis not present

## 2023-03-27 DIAGNOSIS — I700 Atherosclerosis of aorta: Secondary | ICD-10-CM

## 2023-03-27 MED ORDER — GABAPENTIN 300 MG PO CAPS: ORAL_CAPSULE | ORAL | 1 refills | Status: DC

## 2023-03-27 NOTE — Patient Instructions (Signed)
Hi Aaron Griffin  It was good to see you today We will go ahead and order your lab work you can do this in November or December at the latest  Also we will send in the order for coronary calcium scan they will reach out to you to schedule this in the near future when we get the results we will let you know please take care-Damek Ende

## 2023-03-27 NOTE — Progress Notes (Signed)
   Subjective:    Patient ID: Aaron Griffin, male    DOB: 1966/05/08, 57 y.o.   MRN: [id number]  HPI  The patient comes in today for a wellness visit. Very nice patient Recently lost his mother to cancer He is dealing with it fairly well   Has not been eating healthy or exercising on a regular basis mainly due to life issues and stressors but states he will be doing a better job A review of their health history was completed.  A review of medications was also completed.  Any needed refills; none  Eating habits: fair  Falls/  MVA accidents in past few months: no  Regular exercise: works Wellsite geologist pt sees on regular basis: none  Preventative health issues were discussed.   Additional concerns: none   Review of Systems     Objective:   Physical Exam General-in no acute distress Eyes-no discharge Lungs-respiratory rate normal, CTA CV-no murmurs,RRR Extremities skin warm dry no edema Neuro grossly normal Behavior normal, alert Prostate exam normal ears normal throat normal neck no masses abdomen soft       Assessment & Plan:  1. Well adult exam Adult wellness-complete.wellness physical was conducted today. Importance of diet and exercise were discussed in detail.  Importance of stress reduction and healthy living were discussed.  In addition to this a discussion regarding safety was also covered.  We also reviewed over immunizations and gave recommendations regarding current immunization needed for age.   In addition to this additional areas were also touched on including: Preventative health exams needed:  Colonoscopy up-to-date currently next 1 is 2026  Patient was advised yearly wellness exam  - Hemoglobin A1c - Basic Metabolic Panel - Lipid Panel - Hepatic Function Panel - PSA - CT CARDIAC SCORING (SELF PAY ONLY)  2. Other hyperlipidemia Healthy diet recommended patient does not want to be on statin currently.  He will work hard on diet we  will check a coronary calcium he will do his lab work in approximately 3 to 4 months he states he is going devote himself to healthier eating and exercise and see what his numbers look like - Basic Metabolic Panel - Lipid Panel - CT CARDIAC SCORING (SELF PAY ONLY)  3. Prediabetes Portion control minimize starches lab work in 3 to 4 months - Hemoglobin A1c - Basic Metabolic Panel - CT CARDIAC SCORING (SELF PAY ONLY)  4. Screening PSA (prostate specific antigen) Screening PSA recommended - CT CARDIAC SCORING (SELF PAY ONLY)  5. Aortic atherosclerosis (HCC) Statin recommended patient does not want to be on these currently due to side effects - Basic Metabolic Panel - CT CARDIAC SCORING (SELF PAY ONLY)  6. Elevated liver enzymes Check lab work. - Hepatic Function Panel - CT CARDIAC SCORING (SELF PAY ONLY)

## 2023-05-06 ENCOUNTER — Ambulatory Visit (HOSPITAL_COMMUNITY)
Admission: RE | Admit: 2023-05-06 | Discharge: 2023-05-06 | Disposition: A | Discharge status: Home / Self Care | Payer: 59 | Source: Ambulatory Visit | Attending: Family Medicine | Admitting: Family Medicine

## 2023-05-06 DIAGNOSIS — I700 Atherosclerosis of aorta: Secondary | ICD-10-CM | POA: Insufficient documentation

## 2023-05-06 DIAGNOSIS — Z125 Encounter for screening for malignant neoplasm of prostate: Secondary | ICD-10-CM | POA: Insufficient documentation

## 2023-05-06 DIAGNOSIS — R748 Abnormal levels of other serum enzymes: Secondary | ICD-10-CM | POA: Insufficient documentation

## 2023-05-06 DIAGNOSIS — E7849 Other hyperlipidemia: Secondary | ICD-10-CM | POA: Insufficient documentation

## 2023-05-06 DIAGNOSIS — Z0000 Encounter for general adult medical examination without abnormal findings: Secondary | ICD-10-CM | POA: Insufficient documentation

## 2023-05-06 DIAGNOSIS — R7303 Prediabetes: Secondary | ICD-10-CM | POA: Insufficient documentation

## 2023-09-26 ENCOUNTER — Ambulatory Visit
Admission: EM | Admit: 2023-09-26 | Discharge: 2023-09-26 | Disposition: A | Discharge status: Home / Self Care | Payer: Managed Care, Other (non HMO) | Attending: Nurse Practitioner | Admitting: Nurse Practitioner

## 2023-09-26 ENCOUNTER — Encounter: Payer: Self-pay | Admitting: *Deleted

## 2023-09-26 DIAGNOSIS — J101 Influenza due to other identified influenza virus with other respiratory manifestations: Secondary | ICD-10-CM

## 2023-09-26 LAB — POC COVID19/FLU A&B COMBO
Covid Antigen, POC: NEGATIVE
Influenza A Antigen, POC: POSITIVE — AB
Influenza B Antigen, POC: NEGATIVE

## 2023-09-26 MED ORDER — OSELTAMIVIR PHOSPHATE 75 MG PO CAPS: 75.0000 mg | ORAL_CAPSULE | Freq: Two times a day (BID) | ORAL | 0 refills | Status: DC

## 2023-09-26 MED ORDER — PROMETHAZINE-DM 6.25-15 MG/5ML PO SYRP: 5.0000 mL | ORAL_SOLUTION | Freq: Four times a day (QID) | ORAL | 0 refills | Status: DC | PRN
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
## 2023-09-26 NOTE — ED Triage Notes (Signed)
Pt states he started having headache, fatigue, body aches cough yesterday he took some nyquil and dayquil.

## 2023-09-26 NOTE — Discharge Instructions (Signed)
You have tested positive for influenza A. You may take over-the-counter Tylenol or ibuprofen as needed for pain, fever, or general discomfort. Recommend the use of Pedialyte or Gatorade to prevent dehydration. Recommend using a humidifier in the bedroom at nighttime during sleep and sleeping elevated on pillows while cough symptoms persist. If you develop a fever, you should remain home until have been fever free for 24 hours with no medication. Please be advised that symptoms should improve over the next 5 to 7 days.  If symptoms appear to be worsening, or if there are other concerns, you may follow-up in this clinic or with his pediatrician for further evaluation. Follow-up as needed.

## 2023-11-28 ENCOUNTER — Telehealth: Payer: Self-pay

## 2023-11-28 ENCOUNTER — Other Ambulatory Visit: Payer: Self-pay

## 2023-11-28 MED ORDER — GABAPENTIN 300 MG PO CAPS
ORAL_CAPSULE | ORAL | 1 refills | Status: AC
Start: 2023-11-28 — End: ?

## 2023-11-28 NOTE — Telephone Encounter (Signed)
 Prescription Request  11/28/2023  LOV: Visit date not found  What is the name of the medication or equipment? gabapentin  (NEURONTIN ) 300 MG capsule   Have you contacted your pharmacy to request a refill? Yes   Which pharmacy would you like this sent to?  [facility] [phone] - [place], [place] - [address] AT [facility] OF [address] [place] [address] Phone: [phone] Fax: [phone]    Patient notified that their request is being sent to the clinical staff for review and that they should receive a response within 2 business days.   Please advise at Mobile [phone] (mobile)

## 2024-05-03 ENCOUNTER — Encounter (HOSPITAL_COMMUNITY): Payer: Self-pay

## 2024-05-03 ENCOUNTER — Emergency Department (HOSPITAL_COMMUNITY)
Admission: EM | Admit: 2024-05-03 | Discharge: 2024-05-03 | Disposition: A | Discharge status: Home / Self Care | Attending: Emergency Medicine | Admitting: Emergency Medicine

## 2024-05-03 ENCOUNTER — Emergency Department (HOSPITAL_COMMUNITY)

## 2024-05-03 ENCOUNTER — Other Ambulatory Visit: Payer: Self-pay

## 2024-05-03 DIAGNOSIS — J449 Chronic obstructive pulmonary disease, unspecified: Secondary | ICD-10-CM | POA: Diagnosis not present

## 2024-05-03 DIAGNOSIS — S99921A Unspecified injury of right foot, initial encounter: Secondary | ICD-10-CM | POA: Diagnosis present

## 2024-05-03 DIAGNOSIS — S92424A Nondisplaced fracture of distal phalanx of right great toe, initial encounter for closed fracture: Secondary | ICD-10-CM | POA: Insufficient documentation

## 2024-05-03 DIAGNOSIS — Y92410 Unspecified street and highway as the place of occurrence of the external cause: Secondary | ICD-10-CM | POA: Diagnosis not present

## 2024-05-03 DIAGNOSIS — S93124A Dislocation of metatarsophalangeal joint of right lesser toe(s), initial encounter: Secondary | ICD-10-CM | POA: Diagnosis not present

## 2024-05-03 DIAGNOSIS — S93125A Dislocation of metatarsophalangeal joint of left lesser toe(s), initial encounter: Secondary | ICD-10-CM

## 2024-05-03 MED ORDER — LIDOCAINE HCL (PF) 1 % IJ SOLN
10.0000 mL | Freq: Once | INTRAMUSCULAR | Status: AC
  Administered 2024-05-03: 10 mL
  Filled 2024-05-03: qty 10

## 2024-05-03 MED ORDER — OXYCODONE-ACETAMINOPHEN 5-325 MG PO TABS: 1.0000 | ORAL_TABLET | Freq: Four times a day (QID) | ORAL | 0 refills | Status: DC | PRN

## 2024-05-03 MED ORDER — KETOROLAC TROMETHAMINE 15 MG/ML IJ SOLN
15.0000 mg | Freq: Once | INTRAMUSCULAR | Status: AC
  Administered 2024-05-03: 15 mg via INTRAMUSCULAR
  Filled 2024-05-03: qty 1

## 2024-05-03 MED ORDER — OXYCODONE-ACETAMINOPHEN 5-325 MG PO TABS
1.0000 | ORAL_TABLET | Freq: Once | ORAL | Status: AC
  Administered 2024-05-03: 1 via ORAL
  Filled 2024-05-03: qty 1

## 2024-05-03 MED ORDER — NAPROXEN 500 MG PO TABS: 500.0000 mg | ORAL_TABLET | Freq: Two times a day (BID) | ORAL | 0 refills | Status: DC
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
## 2024-05-03 NOTE — Discharge Instructions (Addendum)
 You were seen today for fracture to great toe and dislocation of the second toe.  Will need to have you reevaluated by orthopedic surgery, with information attached this AVS.  Please call their office and schedule an appointment.  I am sending in a anti-inflammatory to use as needed additionally you can use Tylenol .  However realized with the narcotic medication of sending in with that is also had Tylenol  in it.  Please do not exceed more than 4000 total milligrams of Tylenol  in a 24-hour period.  Also please only use narcotics for severe pain as this can cause constipation, bad dreams as well as risk for addiction.  Please take Naprosyn , 500mg  by mouth twice daily as needed for pain - this in an antiinflammatory medicine (NSAID) and is similar to ibuprofen - many people feel that it is stronger than ibuprofen and it is easier to take since it is a smaller pill.  Please use this only for 1 week - if your pain persists, you will need to follow up with your doctor in the office for ongoing guidance and pain control.
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
## 2024-05-03 NOTE — Progress Notes (Signed)
 Orthopedic Tech Progress Note Patient Details:  Aaron Griffin 08-Feb-1966 [id number]  Ortho Devices Type of Ortho Device: Crutches, Post (short leg) splint Ortho Device/Splint Location: rle Ortho Device/Splint Interventions: Ordered, Application, Adjustment  The pt was able to lift their leg during splint application. They did well walking with the crutches. Post Interventions Patient Tolerated: Well Instructions Provided: Care of device, Adjustment of device  Chandra Dorn PARAS 05/03/2024, 10:04 PM

## 2024-05-03 NOTE — ED Notes (Signed)
 Ortho tech at bedside
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
## 2024-05-05 ENCOUNTER — Other Ambulatory Visit: Payer: Self-pay | Admitting: Orthopedic Surgery

## 2024-05-05 DIAGNOSIS — S93324A Dislocation of tarsometatarsal joint of right foot, initial encounter: Secondary | ICD-10-CM

## 2024-05-05 DIAGNOSIS — S92421A Displaced fracture of distal phalanx of right great toe, initial encounter for closed fracture: Secondary | ICD-10-CM

## 2024-05-06 ENCOUNTER — Ambulatory Visit
Admission: RE | Admit: 2024-05-06 | Discharge: 2024-05-06 | Disposition: A | Discharge status: Home / Self Care | Source: Ambulatory Visit | Attending: Orthopedic Surgery | Admitting: Orthopedic Surgery

## 2024-05-06 DIAGNOSIS — S93324A Dislocation of tarsometatarsal joint of right foot, initial encounter: Secondary | ICD-10-CM

## 2024-05-06 DIAGNOSIS — S92421A Displaced fracture of distal phalanx of right great toe, initial encounter for closed fracture: Secondary | ICD-10-CM

## 2024-05-26 ENCOUNTER — Encounter (HOSPITAL_COMMUNITY): Payer: Self-pay | Admitting: Orthopedic Surgery

## 2024-05-26 ENCOUNTER — Other Ambulatory Visit: Payer: Self-pay
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
## 2024-05-26 NOTE — Progress Notes (Signed)
 SDW CALL  Patient was given pre-op instructions over the phone. The opportunity was given for the patient to ask questions. No further questions asked. Patient verbalized understanding of instructions given.   PCP - Glendia Fielding Cardiologist - denies  PPM/ICD - denies   Chest x-ray - denies EKG - denies Stress Test - denies ECHO - denies Cardiac Cath - denies  Sleep Study - OSA+ - wears CPAP at night  No DM  Last dose of GLP1 agonist-  n/a GLP1 instructions:  n/a  Blood Thinner Instructions: n/a Aspirin Instructions: n/a  ERAS Protcol -  NPO PRE-SURGERY Ensure or G2-  n/a  COVID TEST- no   Anesthesia review: no  Patient denies shortness of breath, fever, cough and chest pain over the phone call   All instructions explained to the patient, with a verbal understanding of the material. Patient agrees to go over the instructions while at home for a better understanding.

## 2024-05-27 ENCOUNTER — Ambulatory Visit (HOSPITAL_COMMUNITY)

## 2024-05-27 ENCOUNTER — Ambulatory Visit (HOSPITAL_COMMUNITY)
Admission: RE | Admit: 2024-05-27 | Discharge: 2024-05-27 | Disposition: A | Discharge status: Home / Self Care | Payer: Self-pay | Attending: Orthopedic Surgery | Admitting: Orthopedic Surgery

## 2024-05-27 ENCOUNTER — Ambulatory Visit (HOSPITAL_COMMUNITY): Payer: Self-pay

## 2024-05-27 ENCOUNTER — Encounter (HOSPITAL_COMMUNITY): Payer: Self-pay | Admitting: Orthopedic Surgery

## 2024-05-27 ENCOUNTER — Encounter (HOSPITAL_COMMUNITY)
Admission: RE | Disposition: A | Discharge status: Home / Self Care | Payer: Self-pay | Source: Home / Self Care | Attending: Orthopedic Surgery

## 2024-05-27 ENCOUNTER — Other Ambulatory Visit (HOSPITAL_COMMUNITY): Payer: Self-pay

## 2024-05-27 DIAGNOSIS — S92313A Displaced fracture of first metatarsal bone, unspecified foot, initial encounter for closed fracture: Secondary | ICD-10-CM | POA: Insufficient documentation

## 2024-05-27 DIAGNOSIS — S93326A Dislocation of tarsometatarsal joint of unspecified foot, initial encounter: Secondary | ICD-10-CM | POA: Diagnosis not present

## 2024-05-27 DIAGNOSIS — S92323A Displaced fracture of second metatarsal bone, unspecified foot, initial encounter for closed fracture: Secondary | ICD-10-CM | POA: Insufficient documentation

## 2024-05-27 DIAGNOSIS — S92901A Unspecified fracture of right foot, initial encounter for closed fracture: Secondary | ICD-10-CM

## 2024-05-27 DIAGNOSIS — Z01818 Encounter for other preprocedural examination: Secondary | ICD-10-CM

## 2024-05-27 DIAGNOSIS — W2209XA Striking against other stationary object, initial encounter: Secondary | ICD-10-CM | POA: Diagnosis not present

## 2024-05-27 DIAGNOSIS — G4733 Obstructive sleep apnea (adult) (pediatric): Secondary | ICD-10-CM | POA: Insufficient documentation

## 2024-05-27 DIAGNOSIS — S92243A Displaced fracture of medial cuneiform of unspecified foot, initial encounter for closed fracture: Secondary | ICD-10-CM | POA: Insufficient documentation

## 2024-05-27 HISTORY — PX: OPEN REDUCTION INTERNAL FIXATION (ORIF) FOOT LISFRANC FRACTURE: SHX5990

## 2024-05-27 LAB — CBC WITH DIFFERENTIAL/PLATELET
Abs Immature Granulocytes: 0.04 K/uL (ref 0.00–0.07)
Basophils Absolute: 0.1 K/uL (ref 0.0–0.1)
Basophils Relative: 1 %
Eosinophils Absolute: 0.4 K/uL (ref 0.0–0.5)
Eosinophils Relative: 4 %
HCT: 41.7 % (ref 39.0–52.0)
Hemoglobin: 14 g/dL (ref 13.0–17.0)
Immature Granulocytes: 0 %
Lymphocytes Relative: 31 %
Lymphs Abs: 3.4 K/uL (ref 0.7–4.0)
MCH: 30.7 pg (ref 26.0–34.0)
MCHC: 33.6 g/dL (ref 30.0–36.0)
MCV: 91.4 fL (ref 80.0–100.0)
Monocytes Absolute: 1.1 K/uL — ABNORMAL HIGH (ref 0.1–1.0)
Monocytes Relative: 10 %
Neutro Abs: 5.8 K/uL (ref 1.7–7.7)
Neutrophils Relative %: 54 %
Platelets: 324 K/uL (ref 150–400)
RBC: 4.56 MIL/uL (ref 4.22–5.81)
RDW: 12.3 % (ref 11.5–15.5)
WBC: 10.9 K/uL — ABNORMAL HIGH (ref 4.0–10.5)
nRBC: 0 % (ref 0.0–0.2)

## 2024-05-27 LAB — COMPREHENSIVE METABOLIC PANEL WITH GFR
ALT: 37 U/L (ref 0–44)
AST: 26 U/L (ref 15–41)
Albumin: 4.1 g/dL (ref 3.5–5.0)
Alkaline Phosphatase: 55 U/L (ref 38–126)
Anion gap: 12 (ref 5–15)
BUN: 12 mg/dL (ref 6–20)
CO2: 24 mmol/L (ref 22–32)
Calcium: 9.6 mg/dL (ref 8.9–10.3)
Chloride: 99 mmol/L (ref 98–111)
Creatinine, Ser: 1.05 mg/dL (ref 0.61–1.24)
GFR, Estimated: >60 mL/min (ref >60)
Glucose, Bld: 132 mg/dL — ABNORMAL HIGH (ref 70–99)
Potassium: 4 mmol/L (ref 3.5–5.1)
Sodium: 135 mmol/L (ref 135–145)
Total Bilirubin: 0.8 mg/dL (ref 0.0–1.2)
Total Protein: 7.6 g/dL (ref 6.5–8.1)

## 2024-05-27 MED ORDER — FENTANYL CITRATE (PF) 100 MCG/2ML IJ SOLN
INTRAMUSCULAR | Status: AC
  Filled 2024-05-27: qty 2

## 2024-05-27 MED ORDER — CEFAZOLIN SODIUM-DEXTROSE 2-4 GM/100ML-% IV SOLN
2.0000 g | INTRAVENOUS | Status: AC
  Administered 2024-05-27: 2 g via INTRAVENOUS
  Filled 2024-05-27: qty 100

## 2024-05-27 MED ORDER — PHENYLEPHRINE HCL (PRESSORS) 10 MG/ML IV SOLN
INTRAVENOUS | Status: DC | PRN
  Administered 2024-05-27: 160 ug via INTRAVENOUS
  Administered 2024-05-27: 40 ug via INTRAVENOUS
  Administered 2024-05-27: 160 ug via INTRAVENOUS
  Administered 2024-05-27: 80 ug via INTRAVENOUS
  Administered 2024-05-27: 160 ug via INTRAVENOUS
  Administered 2024-05-27 (×2): 80 ug via INTRAVENOUS
  Administered 2024-05-27: 40 ug via INTRAVENOUS
  Administered 2024-05-27: 160 ug via INTRAVENOUS
  Administered 2024-05-27 (×4): 80 ug via INTRAVENOUS

## 2024-05-27 MED ORDER — DEXAMETHASONE SOD PHOSPHATE PF 10 MG/ML IJ SOLN
INTRAMUSCULAR | Status: DC | PRN
  Administered 2024-05-27: 10 mg via INTRAVENOUS

## 2024-05-27 MED ORDER — PROPOFOL 10 MG/ML IV BOLUS
INTRAVENOUS | Status: AC
  Filled 2024-05-27: qty 20

## 2024-05-27 MED ORDER — MIDAZOLAM HCL 2 MG/2ML IJ SOLN
INTRAMUSCULAR | Status: DC | PRN
  Administered 2024-05-27: 2 mg via INTRAVENOUS

## 2024-05-27 MED ORDER — METHOCARBAMOL 500 MG PO TABS
500.0000 mg | ORAL_TABLET | Freq: Four times a day (QID) | ORAL | 0 refills | Status: AC | PRN
  Filled 2024-05-27: qty 60, 8d supply, fill #0

## 2024-05-27 MED ORDER — OXYCODONE HCL 5 MG/5ML PO SOLN: 5.0000 mg | Freq: Once | ORAL | Status: DC | PRN

## 2024-05-27 MED ORDER — ACETAMINOPHEN 10 MG/ML IV SOLN: 1000.0000 mg | Freq: Once | INTRAVENOUS | Status: DC | PRN

## 2024-05-27 MED ORDER — ORAL CARE MOUTH RINSE: 15.0000 mL | Freq: Once | OROMUCOSAL | Status: AC

## 2024-05-27 MED ORDER — PROPOFOL 10 MG/ML IV BOLUS
INTRAVENOUS | Status: DC | PRN
  Administered 2024-05-27: 200 mg via INTRAVENOUS

## 2024-05-27 MED ORDER — HYDROCODONE-ACETAMINOPHEN 5-325 MG PO TABS
1.0000 | ORAL_TABLET | Freq: Four times a day (QID) | ORAL | 0 refills | Status: AC | PRN
  Filled 2024-05-27: qty 40, 5d supply, fill #0

## 2024-05-27 MED ORDER — FENTANYL CITRATE (PF) 100 MCG/2ML IJ SOLN
INTRAMUSCULAR | Status: AC
  Administered 2024-05-27: 50 ug via INTRAVENOUS
  Filled 2024-05-27: qty 2

## 2024-05-27 MED ORDER — KETAMINE HCL 50 MG/5ML IJ SOSY
PREFILLED_SYRINGE | INTRAMUSCULAR | Status: AC
  Filled 2024-05-27: qty 5

## 2024-05-27 MED ORDER — FENTANYL CITRATE (PF) 100 MCG/2ML IJ SOLN: 25.0000 ug | INTRAMUSCULAR | Status: DC | PRN

## 2024-05-27 MED ORDER — MIDAZOLAM HCL 2 MG/2ML IJ SOLN
INTRAMUSCULAR | Status: AC
  Administered 2024-05-27: 2 mg via INTRAVENOUS
  Filled 2024-05-27: qty 2

## 2024-05-27 MED ORDER — ONDANSETRON HCL 4 MG/2ML IJ SOLN
INTRAMUSCULAR | Status: AC
  Filled 2024-05-27: qty 2

## 2024-05-27 MED ORDER — MIDAZOLAM HCL 2 MG/2ML IJ SOLN: 2.0000 mg | Freq: Once | INTRAMUSCULAR | Status: AC

## 2024-05-27 MED ORDER — ONDANSETRON 4 MG PO TBDP
4.0000 mg | ORAL_TABLET | Freq: Three times a day (TID) | ORAL | 0 refills | Status: AC | PRN
  Filled 2024-05-27: qty 18, 6d supply, fill #0

## 2024-05-27 MED ORDER — MIDAZOLAM HCL 2 MG/2ML IJ SOLN
INTRAMUSCULAR | Status: AC
  Filled 2024-05-27: qty 2

## 2024-05-27 MED ORDER — FENTANYL CITRATE (PF) 100 MCG/2ML IJ SOLN: 50.0000 ug | Freq: Once | INTRAMUSCULAR | Status: AC

## 2024-05-27 MED ORDER — FENTANYL CITRATE (PF) 250 MCG/5ML IJ SOLN
INTRAMUSCULAR | Status: DC | PRN
  Administered 2024-05-27 (×6): 25 ug via INTRAVENOUS

## 2024-05-27 MED ORDER — ONDANSETRON HCL 4 MG/2ML IJ SOLN
INTRAMUSCULAR | Status: DC | PRN
  Administered 2024-05-27: 4 mg via INTRAVENOUS

## 2024-05-27 MED ORDER — CHLORHEXIDINE GLUCONATE 0.12 % MT SOLN
15.0000 mL | Freq: Once | OROMUCOSAL | Status: AC
  Administered 2024-05-27: 15 mL via OROMUCOSAL
  Filled 2024-05-27: qty 15

## 2024-05-27 MED ORDER — BUPIVACAINE HCL (PF) 0.25 % IJ SOLN
INTRAMUSCULAR | Status: DC | PRN
  Administered 2024-05-27: 25 mL via EPIDURAL
  Administered 2024-05-27: 15 mL via EPIDURAL

## 2024-05-27 MED ORDER — LACTATED RINGERS IV SOLN: INTRAVENOUS | Status: DC

## 2024-05-27 MED ORDER — ASPIRIN 81 MG PO TBEC
81.0000 mg | DELAYED_RELEASE_TABLET | Freq: Two times a day (BID) | ORAL | 0 refills | Status: AC
  Filled 2024-05-27: qty 60, 30d supply, fill #0

## 2024-05-27 MED ORDER — DEXMEDETOMIDINE HCL IN NACL 80 MCG/20ML IV SOLN
INTRAVENOUS | Status: DC | PRN
  Administered 2024-05-27: 4 ug via INTRAVENOUS

## 2024-05-27 MED ORDER — DROPERIDOL 2.5 MG/ML IJ SOLN: 0.6250 mg | Freq: Once | INTRAMUSCULAR | Status: DC | PRN

## 2024-05-27 MED ORDER — OXYCODONE HCL 5 MG PO TABS: 5.0000 mg | ORAL_TABLET | Freq: Once | ORAL | Status: DC | PRN

## 2024-05-27 MED ORDER — 0.9 % SODIUM CHLORIDE (POUR BTL) OPTIME
TOPICAL | Status: DC | PRN
  Administered 2024-05-27: 1000 mL

## 2024-05-27 MED ORDER — PHENYLEPHRINE 80 MCG/ML (10ML) SYRINGE FOR IV PUSH (FOR BLOOD PRESSURE SUPPORT)
PREFILLED_SYRINGE | INTRAVENOUS | Status: AC
Start: 2024-05-27 — End: 2024-05-27
  Filled 2024-05-27: qty 10

## 2024-05-27 MED ORDER — KETAMINE HCL 10 MG/ML IJ SOLN
INTRAMUSCULAR | Status: DC | PRN
  Administered 2024-05-27: 30 mg via INTRAVENOUS

## 2024-05-27 MED ORDER — PHENYLEPHRINE HCL-NACL 20-0.9 MG/250ML-% IV SOLN
INTRAVENOUS | Status: AC
  Filled 2024-05-27: qty 250

## 2024-05-27 NOTE — Anesthesia Preprocedure Evaluation (Signed)
 Anesthesia Evaluation  Patient identified by MRN, date of birth, ID band Patient awake    Reviewed: Allergy & Precautions, H&P , NPO status , Patient's Chart, lab work & pertinent test results  Airway Mallampati: II  TM Distance: >3 FB Neck ROM: Full    Dental no notable dental hx.    Pulmonary sleep apnea , COPD, former smoker   Pulmonary exam normal breath sounds clear to auscultation       Cardiovascular (-) hypertension(-) angina (-) Past MI negative cardio ROS Normal cardiovascular exam Rhythm:Regular Rate:Normal     Neuro/Psych neg Seizures negative neurological ROS  negative psych ROS   GI/Hepatic negative GI ROS, Neg liver ROS,neg GERD  ,,  Endo/Other  negative endocrine ROS    Renal/GU negative Renal ROS  negative genitourinary   Musculoskeletal negative musculoskeletal ROS (+)  Lis franc injury to right foot   Abdominal   Peds negative pediatric ROS (+)  Hematology negative hematology ROS (+)   Anesthesia Other Findings   Reproductive/Obstetrics negative OB ROS                              Anesthesia Physical Anesthesia Plan  ASA: 2  Anesthesia Plan: General and Regional   Post-op Pain Management: Regional block*   Induction: Intravenous  PONV Risk Score and Plan: 2 and Ondansetron , Dexamethasone , Midazolam  and Treatment may vary due to age or medical condition  Airway Management Planned: LMA  Additional Equipment: None  Intra-op Plan:   Post-operative Plan: Extubation in OR  Informed Consent: I have reviewed the patients History and Physical, chart, labs and discussed the procedure including the risks, benefits and alternatives for the proposed anesthesia with the patient or authorized representative who has indicated his/her understanding and acceptance.     Dental advisory given  Plan Discussed with: CRNA  Anesthesia Plan Comments:           Anesthesia Quick Evaluation

## 2024-05-27 NOTE — Anesthesia Procedure Notes (Signed)
 Anesthesia Regional Block: Adductor canal block   Pre-Anesthetic Checklist: , timeout performed,  Correct Patient, Correct Site, Correct Laterality,  Correct Procedure, Correct Position, site marked,  Risks and benefits discussed,  Surgical consent,  Pre-op evaluation,  At surgeon's request and post-op pain management  Laterality: Right  Prep: chloraprep       Needles:  Injection technique: Single-shot  Needle Type: Echogenic Stimulator Needle     Needle Length: 9cm  Needle Gauge: 21     Additional Needles:   Procedures:,,,, ultrasound used (permanent image in chart),,    Narrative:  Start time: 05/27/2024 7:40 AM End time: 05/27/2024 7:45 AM Injection made incrementally with aspirations every 5 mL.  Performed by: Personally  Anesthesiologist: Erma Thom SAUNDERS, MD  Additional Notes: Discussed risks and benefits of the nerve block in detail, including but not limited vascular injury, permanent nerve damage and infection.   Patient tolerated the procedure well. Local anesthetic introduced in an incremental fashion under minimal resistance after negative aspirations. No paresthesias were elicited. After completion of the procedure, no acute issues were identified and patient continued to be monitored by RN.

## 2024-05-27 NOTE — Transfer of Care (Signed)
 Immediate Anesthesia Transfer of Care Note  Patient: Aaron Griffin  Procedure(s) Performed: OPEN REDUCTION INTERNAL FIXATION (ORIF) RIGHT FOOT FRACTURES WITH REPAIR OF ANTERIOR TIBIAL TENDON (Right: Foot)  Patient Location: PACU  Anesthesia Type:General  Level of Consciousness: awake and alert   Airway & Oxygen Therapy: Patient Spontanous Breathing and Patient connected to nasal cannula oxygen  Post-op Assessment: Report given to RN and Post -op Vital signs reviewed and stable  Post vital signs: Reviewed and stable  Last Vitals:  Vitals Value Taken Time  BP 111/63 05/27/24 12:15  Temp 36.7 C 05/27/24 12:15  Pulse 87 05/27/24 12:24  Resp 14 05/27/24 12:24  SpO2 92 % 05/27/24 12:24  Vitals shown include unfiled device data.  Last Pain:  Vitals:   05/27/24 1215  TempSrc:   PainSc: Asleep         Complications: No notable events documented.

## 2024-05-27 NOTE — Anesthesia Postprocedure Evaluation (Signed)
 Anesthesia Post Note  Patient: Aaron Griffin  Procedure(s) Performed: OPEN REDUCTION INTERNAL FIXATION (ORIF) RIGHT FOOT FRACTURES WITH REPAIR OF ANTERIOR TIBIAL TENDON (Right: Foot)     Patient location during evaluation: PACU Anesthesia Type: Regional and General Level of consciousness: awake and alert Pain management: pain level controlled Vital Signs Assessment: post-procedure vital signs reviewed and stable Respiratory status: spontaneous breathing, nonlabored ventilation, respiratory function stable and patient connected to nasal cannula oxygen Cardiovascular status: blood pressure returned to baseline and stable Postop Assessment: no apparent nausea or vomiting Anesthetic complications: no   No notable events documented.  Last Vitals:  Vitals:   05/27/24 1230 05/27/24 1245  BP: 124/74 125/79  Pulse: 94 95  Resp: 20 18  Temp:  37.2 C  SpO2: 92% 95%    Last Pain:  Vitals:   05/27/24 1245  TempSrc:   PainSc: 0-No pain                 Thom JONELLE Peoples

## 2024-05-27 NOTE — Anesthesia Procedure Notes (Signed)
 Procedure Name: LMA Insertion Date/Time: 05/27/2024 8:34 AM  Performed by: Kolbie Clarkston, CRNAPre-anesthesia Checklist: Patient identified, Emergency Drugs available, Suction available and Patient being monitored Patient Re-evaluated:Patient Re-evaluated prior to induction Oxygen Delivery Method: Circle System Utilized Preoxygenation: Pre-oxygenation with 100% oxygen Induction Type: IV induction Ventilation: Mask ventilation without difficulty LMA: LMA inserted LMA Size: 5.0 Number of attempts: 1 Airway Equipment and Method: Bite block Placement Confirmation: positive ETCO2 Tube secured with: Tape Dental Injury: Teeth and Oropharynx as per pre-operative assessment  Difficulty Due To: Difficulty was unanticipated

## 2024-05-27 NOTE — Anesthesia Procedure Notes (Signed)
 Anesthesia Regional Block: Popliteal block   Pre-Anesthetic Checklist: , timeout performed,  Correct Patient, Correct Site, Correct Laterality,  Correct Procedure, Correct Position, site marked,  Risks and benefits discussed,  Surgical consent,  Pre-op evaluation,  At surgeon's request and post-op pain management  Laterality: Right  Prep: chloraprep       Needles:  Injection technique: Single-shot  Needle Type: Echogenic Stimulator Needle     Needle Length: 9cm  Needle Gauge: 21     Additional Needles:   Procedures:,,,, ultrasound used (permanent image in chart),,    Narrative:  Start time: 05/27/2024 7:40 AM End time: 05/27/2024 7:45 AM Injection made incrementally with aspirations every 5 mL.  Performed by: Personally  Anesthesiologist: Erma Thom SAUNDERS, MD  Additional Notes: Discussed risks and benefits of the nerve block in detail, including but not limited vascular injury, permanent nerve damage and infection.   Patient tolerated the procedure well. Local anesthetic introduced in an incremental fashion under minimal resistance after negative aspirations. No paresthesias were elicited. After completion of the procedure, no acute issues were identified and patient continued to be monitored by RN.

## 2024-05-27 NOTE — Discharge Instructions (Addendum)
 Orthopaedic Trauma Service Discharge Instructions   General Discharge Instructions   WEIGHT BEARING STATUS: Nonweightbearing right leg x 6-8 weeks   RANGE OF MOTION/ACTIVITY: Activity as tolerated while maintaining weightbearing restrictions noted above  Bone health:   Review the following resource for additional information regarding bone health  [email or web address]  Wound Care: Leave splint in place until first follow-up visit.  Keep splint clean and dry.  DVT/PE prophylaxis: Aspirin 81 mg every 12 hours for 30 days for blood clot prevention  Diet: as you were eating previously.  Can use over the counter stool softeners and bowel preparations, such as Miralax , to help with bowel movements.  Narcotics can be constipating.  Be sure to drink plenty of fluids  PAIN MEDICATION USE AND EXPECTATIONS  You have likely been given narcotic medications to help control your pain.  After a traumatic event that results in an fracture (broken bone) with or without surgery, it is ok to use narcotic pain medications to help control one's pain.  We understand that everyone responds to pain differently and each individual patient will be evaluated on a regular basis for the continued need for narcotic medications. Ideally, narcotic medication use should last no more than 6-8 weeks (coinciding with fracture healing).   As a patient it is your responsibility as well to monitor narcotic medication use and report the amount and frequency you use these medications when you come to your office visit.   We would also advise that if you are using narcotic medications, you should take a dose prior to therapy to maximize you participation.  IF YOU ARE ON NARCOTIC MEDICATIONS IT IS NOT PERMISSIBLE TO OPERATE A MOTOR VEHICLE (MOTORCYCLE/CAR/TRUCK/MOPED) OR HEAVY MACHINERY DO NOT MIX NARCOTICS WITH OTHER CNS (CENTRAL NERVOUS SYSTEM) DEPRESSANTS SUCH AS ALCOHOL   POST-OPERATIVE OPIOID TAPER  INSTRUCTIONS: It is important to wean off of your opioid medication as soon as possible. If you do not need pain medication after your surgery it is ok to stop day one. Opioids include: Codeine, Hydrocodone(Norco, Vicodin), Oxycodone (Percocet, oxycontin ) and hydromorphone  amongst others.  Long term and even short term use of opiods can cause: Increased pain response Dependence Constipation Depression Respiratory depression And more.  Withdrawal symptoms can include Flu like symptoms Nausea, vomiting And more Techniques to manage these symptoms Hydrate well Eat regular healthy meals Stay active Use relaxation techniques(deep breathing, meditating, yoga) Do Not substitute Alcohol to help with tapering If you have been on opioids for less than two weeks and do not have pain than it is ok to stop all together.  Plan to wean off of opioids This plan should start within one week post op of your fracture surgery  Maintain the same interval or time between taking each dose and first decrease the dose.  Cut the total daily intake of opioids by one tablet each day Next start to increase the time between doses. The last dose that should be eliminated is the evening dose.    STOP SMOKING OR USING NICOTINE PRODUCTS!!!!  As discussed nicotine severely impairs your body's ability to heal surgical and traumatic wounds but also impairs bone healing.  Wounds and bone heal by forming microscopic blood vessels (angiogenesis) and nicotine is a vasoconstrictor (essentially, shrinks blood vessels).  Therefore, if vasoconstriction occurs to these microscopic blood vessels they essentially disappear and are unable to deliver necessary nutrients to the healing tissue.  This is one modifiable factor that you can do to dramatically increase your chances of healing your  injury.    (This means no smoking, no nicotine gum, patches, etc)  DO NOT USE NONSTEROIDAL ANTI-INFLAMMATORY DRUGS (NSAID'S)  Using products  such as Advil (ibuprofen), Aleve  (naproxen ), Motrin (ibuprofen) for additional pain control during fracture healing can delay and/or prevent the healing response.  If you would like to take over the counter (OTC) medication, Tylenol  (acetaminophen ) is ok.  However, some narcotic medications that are given for pain control contain acetaminophen  as well. Therefore, you should not exceed more than 4000 mg of tylenol  in a day if you do not have liver disease.  Also note that there are may OTC medicines, such as cold medicines and allergy medicines that my contain tylenol  as well.  If you have any questions about medications and/or interactions please ask your doctor/PA or your pharmacist.      ICE AND ELEVATE INJURED/OPERATIVE EXTREMITY  Using ice and elevating the injured extremity above your heart can help with swelling and pain control.  Icing in a pulsatile fashion, such as 20 minutes on and 20 minutes off, can be followed.    Do not place ice directly on skin. Make sure there is a barrier between to skin and the ice pack.    Using frozen items such as frozen peas works well as the conform nicely to the are that needs to be iced.  USE AN ACE WRAP OR TED HOSE FOR SWELLING CONTROL  In addition to icing and elevation, Ace wraps or TED hose are used to help limit and resolve swelling.  It is recommended to use Ace wraps or TED hose until you are informed to stop.    When using Ace Wraps start the wrapping distally (farthest away from the body) and wrap proximally (closer to the body)   Example: If you had surgery on your leg and you do not have a splint on, start the ace wrap at the toes and work your way up to the thigh        If you had surgery on your upper extremity and do not have a splint on, start the ace wrap at your fingers and work your way up to the upper arm  IF YOU ARE IN A SPLINT OR CAST DO NOT REMOVE IT FOR ANY REASON   If your splint gets wet for any reason please contact the office  immediately. You may shower in your splint or cast as long as you keep it dry.  This can be done by wrapping in a cast cover or garbage back (or similar)  Do Not stick any thing down your splint or cast such as pencils, money, or hangers to try and scratch yourself with.  If you feel itchy take benadryl  as prescribed on the bottle for itching  IF YOU ARE IN A CAM BOOT (BLACK BOOT)  You may remove boot periodically. Perform daily dressing changes as noted below.  Wash the liner of the boot regularly and wear a sock when wearing the boot. It is recommended that you sleep in the boot until told otherwise    Call office for the following: Temperature greater than 101F Persistent nausea and vomiting Severe uncontrolled pain Redness, tenderness, or signs of infection (pain, swelling, redness, odor or green/yellow discharge around the site) Difficulty breathing, headache or visual disturbances Hives Persistent dizziness or light-headedness Extreme fatigue Any other questions or concerns you may have after discharge  In an emergency, call 911 or go to an Emergency Department at a nearby hospital  HELPFUL  INFORMATION  If you had a block, it will wear off between 8-24 hrs postop typically.  This is period when your pain may go from nearly zero to the pain you would have had postop without the block.  This is an abrupt transition but nothing dangerous is happening.  You may take an extra dose of narcotic when this happens.  You should wean off your narcotic medicines as soon as you are able.  Most patients will be off or using minimal narcotics before their first postop appointment.   We suggest you use the pain medication the first night prior to going to bed, in order to ease any pain when the anesthesia wears off. You should avoid taking pain medications on an empty stomach as it will make you nauseous.  Do not drink alcoholic beverages or take illicit drugs when taking pain medications.  In  most states it is against the law to drive while you are in a splint or sling.  And certainly against the law to drive while taking narcotics.  You may return to work/school in the next couple of days when you feel up to it.   Pain medication may make you constipated.  Below are a few solutions to try in this order: Decrease the amount of pain medication if you aren't having pain. Drink lots of decaffeinated fluids. Drink prune juice and/or each dried prunes  If the first 3 don't work start with additional solutions Take Colace - an over-the-counter stool softener Take Senokot - an over-the-counter laxative Take Miralax  - a stronger over-the-counter laxative     CALL THE OFFICE WITH ANY QUESTIONS OR CONCERNS: [phone]   VISIT OUR WEBSITE FOR ADDITIONAL INFORMATION: [email or web address]

## 2024-05-28 ENCOUNTER — Encounter (HOSPITAL_COMMUNITY): Payer: Self-pay | Admitting: Orthopedic Surgery

## 2024-05-28 ENCOUNTER — Encounter: Payer: Self-pay | Admitting: Family Medicine

## 2024-07-04 NOTE — Op Note (Signed)
#  [id number] 

## 2024-07-04 NOTE — Op Note (Signed)
 NAME: Aaron Griffin, Aaron Griffin MEDICAL RECORD NO: [id number] ACCOUNT NO: [id number] DATE OF BIRTH: 1965-12-01 FACILITY: [facility] LOCATION: [facility] PHYSICIAN: Ozell DEL. Celena, MD  Operative Report   DATE OF PROCEDURE: 05/27/2024  PREOPERATIVE DIAGNOSES: 1.  Right foot medial cuneiform fracture dislocation. 2.  Base of first metatarsal fracture. 3.  Tarsometatarsal dislocation and adjacent fracture of the second metatarsal.  POSTOPERATIVE DIAGNOSES: 1.  Right foot medial cuneiform fracture dislocation. 2.  Base of first metatarsal fracture. 3.  Tarsometatarsal dislocation and adjacent fracture of the second metatarsal.  PROCEDURES: 1.  ORIF of right first metatarsal fracture. 2.  Open treatment of right medial cuneiform tarsal fracture. 3.  Open reduction and fixation of tarsal dislocation. 4.  Open reduction and fixation of tarsometatarsal dislocation.  SURGEON:  Ozell Celena, MD.  ASSISTANT:  Francis Mt, [facility].  ANESTHESIA:  General supplemented with regional nerve block.  COMPLICATIONS:  None.  TOURNIQUET:  None.  DISPOSITION:  To PACU.  CONDITION:  Stable.  BRIEF SUMMARY OF INDICATIONS FOR PROCEDURE:  The patient is a very pleasant 58 year old male who sustained a severe trauma to his right foot.  His swelling was so severe that it precluded early ORIF and the patient has been followed for improvement.  He  underwent a closed reduction at the time of presentation to the ED.  Subsequent x-rays however demonstrated migration and dislocation of the tarsal joint at the medial cuneiform fracture and its articulation with the navicular in addition to a continued  instability at the tarsometatarsal or Lisfranc joint.  I did discuss with the patient risks and benefits of surgery including the possibility of failure to obtain reduction, need for primary fusion, DVT, PE, loss of motion, need for hardware removal or  hardware breakage, and many others.  He acknowledged these risks and did  provide consent to proceed.  BRIEF SUMMARY OF PROCEDURE:  The patient was taken to the operating room where general anesthesia was induced.  He had received a preoperative nerve block.  We began with the medial side bringing in the x-ray and identifying the appropriate position for  instrumentation at the navicular and then down into the first metatarsal.  I then made the entirety of the longitudinal incision but did not open the deep or periosteal layer.  I obtained fixation into the distal fixation proximally and applied a  distraction device between the navicular or the talus and the first metatarsal.  With considerable effort, I cranked this out to restore length.  I had to perform an open reduction releasing the capsule over the medial cuneiform and taking it from a  medial position on the navicular and getting it back into the tarsal joint.  I supplemented this effort with a ball-spike pusher and then was able to obtain fixation with the plate into the first metatarsal distally.  Before doing so, I derotated the  first metatarsal to match with the fractured base of the first metatarsal and to obtain reduction in that manner.  Consequently after application of the 3 distal screws in addition to the 3 proximal screws, open reduction and internal fixation have been  obtained for the dislocated medial cuneiform, the medial cuneiform fracture, and the first metatarsal fracture.  Attention was then turned to the dorsal aspect of the foot where a longitudinal incision was made over the intermediate and lateral  cuneiforms as well as the base of the second and third metatarsals.  Here the ball-spike pusher was used once again to reduce the joint, in this  case the tarsometatarsal joint, which was pinned provisionally.  I then secured fixation with the Stratum  fusion type H plate obtaining 2 cortices distally and 2 proximally across the joint.  Final images showed appropriate reduction and screw placement and  no complications.  Wounds were irrigated thoroughly and closed in standard layer fashion and then a  posterior stirrup splint applied.  Francis Mt, [facility] was present and assisted me during this very difficult case and an assistant was required to obtain and maintain reduction and also assisted with closure.  PROGNOSIS:  The patient will be strictly nonweightbearing and unrestricted range of motion of the ankle and toes as soon as the splint is removed.  We will see him back in the office in 2 weeks for removal of sutures.  He will be on formal pharmacologic  DVT prophylaxis.   NIK D: 07/04/2024 3:21:04 pm T: 07/04/2024 10:13:00 pm  JOB: [id number]/ [id number]
